# Patient Record
Sex: Female | Born: 1950 | Race: White | Hispanic: No | Marital: Single | Smoking: Never smoker
Health system: Southern US, Community
[De-identification: ages and names within clinical notes are randomized; demographics above are authoritative.]

## PROBLEM LIST (undated history)

## (undated) DIAGNOSIS — T7840XA Allergy, unspecified, initial encounter: Secondary | ICD-10-CM

## (undated) DIAGNOSIS — I619 Nontraumatic intracerebral hemorrhage, unspecified: Secondary | ICD-10-CM

## (undated) DIAGNOSIS — K219 Gastro-esophageal reflux disease without esophagitis: Secondary | ICD-10-CM

## (undated) DIAGNOSIS — I10 Essential (primary) hypertension: Secondary | ICD-10-CM

## (undated) HISTORY — PX: TONSILLECTOMY: SUR1361

## (undated) HISTORY — PX: APPENDECTOMY: SHX54

## (undated) HISTORY — DX: Gastro-esophageal reflux disease without esophagitis: K21.9

## (undated) HISTORY — PX: ABDOMINAL HYSTERECTOMY: SHX81

## (undated) HISTORY — PX: CHOLECYSTECTOMY: SHX55

## (undated) HISTORY — DX: Allergy, unspecified, initial encounter: T78.40XA

---

## 2019-12-03 ENCOUNTER — Emergency Department: Payer: BC Managed Care – PPO

## 2019-12-03 ENCOUNTER — Emergency Department
Admission: EM | Admit: 2019-12-03 | Discharge: 2019-12-04 | Disposition: A | Discharge status: Other Acute Inpatient Hospital | Payer: BC Managed Care – PPO | Attending: Emergency Medicine | Admitting: Emergency Medicine

## 2019-12-03 ENCOUNTER — Other Ambulatory Visit: Payer: Self-pay

## 2019-12-03 DIAGNOSIS — Z20822 Contact with and (suspected) exposure to covid-19: Secondary | ICD-10-CM | POA: Insufficient documentation

## 2019-12-03 DIAGNOSIS — I619 Nontraumatic intracerebral hemorrhage, unspecified: Secondary | ICD-10-CM | POA: Insufficient documentation

## 2019-12-03 DIAGNOSIS — R4182 Altered mental status, unspecified: Secondary | ICD-10-CM | POA: Diagnosis present

## 2019-12-03 LAB — URINE DRUG SCREEN, QUALITATIVE (ARMC ONLY)
Amphetamines, Ur Screen: NOT DETECTED
Barbiturates, Ur Screen: NOT DETECTED
Benzodiazepine, Ur Scrn: NOT DETECTED
Cannabinoid 50 Ng, Ur ~~LOC~~: NOT DETECTED
Cocaine Metabolite,Ur ~~LOC~~: NOT DETECTED
MDMA (Ecstasy)Ur Screen: NOT DETECTED
Methadone Scn, Ur: NOT DETECTED
Opiate, Ur Screen: NOT DETECTED
Phencyclidine (PCP) Ur S: NOT DETECTED
Tricyclic, Ur Screen: NOT DETECTED

## 2019-12-03 LAB — BASIC METABOLIC PANEL
Anion gap: 8 (ref 5–15)
BUN: 16 mg/dL (ref 8–23)
CO2: 28 mmol/L (ref 22–32)
Calcium: 8.7 mg/dL — ABNORMAL LOW (ref 8.9–10.3)
Chloride: 104 mmol/L (ref 98–111)
Creatinine, Ser: 1.14 mg/dL — ABNORMAL HIGH (ref 0.44–1.00)
GFR calc Af Amer: 58 mL/min — ABNORMAL LOW (ref >60)
GFR calc non Af Amer: 50 mL/min — ABNORMAL LOW (ref >60)
Glucose, Bld: 88 mg/dL (ref 70–99)
Potassium: 3.4 mmol/L — ABNORMAL LOW (ref 3.5–5.1)
Sodium: 140 mmol/L (ref 135–145)

## 2019-12-03 LAB — URINALYSIS, COMPLETE (UACMP) WITH MICROSCOPIC
Bacteria, UA: NONE SEEN
Bilirubin Urine: NEGATIVE
Glucose, UA: NEGATIVE mg/dL
Hgb urine dipstick: NEGATIVE
Ketones, ur: NEGATIVE mg/dL
Leukocytes,Ua: NEGATIVE
Nitrite: NEGATIVE
Protein, ur: NEGATIVE mg/dL
Specific Gravity, Urine: 1.005 (ref 1.005–1.030)
pH: 8 (ref 5.0–8.0)

## 2019-12-03 LAB — CBC
HCT: 39.7 % (ref 36.0–46.0)
Hemoglobin: 12.7 g/dL (ref 12.0–15.0)
MCH: 28.4 pg (ref 26.0–34.0)
MCHC: 32 g/dL (ref 30.0–36.0)
MCV: 88.8 fL (ref 80.0–100.0)
Platelets: 274 10*3/uL (ref 150–400)
RBC: 4.47 MIL/uL (ref 3.87–5.11)
RDW: 14 % (ref 11.5–15.5)
WBC: 6.4 10*3/uL (ref 4.0–10.5)
nRBC: 0 % (ref 0.0–0.2)

## 2019-12-03 LAB — SARS CORONAVIRUS 2 BY RT PCR (HOSPITAL ORDER, PERFORMED IN ~~LOC~~ HOSPITAL LAB): SARS Coronavirus 2: NEGATIVE

## 2019-12-03 MED ORDER — NICARDIPINE HCL IN NACL 20-0.86 MG/200ML-% IV SOLN
3.0000 mg/h | INTRAVENOUS | Status: DC
  Administered 2019-12-03: 5 mg/h via INTRAVENOUS
  Filled 2019-12-03 (×2): qty 200

## 2019-12-03 MED ORDER — GADOBUTROL 1 MMOL/ML IV SOLN
6.0000 mL | Freq: Once | INTRAVENOUS | Status: AC | PRN
  Administered 2019-12-03: 6 mL via INTRAVENOUS

## 2019-12-03 NOTE — ED Notes (Signed)
Pt transported to MRI 

## 2019-12-03 NOTE — ED Notes (Addendum)
Pt presents to the ED due to forgetfulness. States that she got her COVID vaccine on Thursday and felt like this on Saturday. Pt states she was lightheaded and dizzy on Saturday but not currently. Pt is A&Ox4 and NAD at this time.

## 2019-12-03 NOTE — ED Provider Notes (Signed)
[facility] Emergency Department Provider Note   ____________________________________________    I have reviewed the triage vital signs and the nursing notes.   HISTORY  Chief Complaint Memory problems    HPI Alicia Cherry is a 69 y.o. female who presents with complaints of problems with memory.  Patient notes she has never had problems with memory, she does work in [employer].  She did have Covid in the fall, had first dose of Moderna vaccine 4 days ago.  2 days after vaccine which she reports only gave her mild fatigue she went to work and realized that she could not remember her passwords, this is highly unusual for her.  She went home and also had difficulty remembering how to use her TV.  She thought this would get better and went to work the next day and found that she was also having difficulty with simple tasks and had forgotten her passwords again.  She denies headaches, no extremity weakness numbness or tingling.  No nausea or vomiting.  No trauma.  No fevers or chills  History reviewed. No pertinent past medical history.  There are no problems to display for this patient.   History reviewed. No pertinent surgical history.  Prior to Admission medications   Not on File     Allergies Patient has no known allergies.  History reviewed. No pertinent family history.  Social History Social History   Tobacco Use  . Smoking status: Never Smoker  . Smokeless tobacco: Never Used  Substance Use Topics  . Alcohol use: Not Currently  . Drug use: Never    Review of Systems  Constitutional: No fever/chills Eyes: No visual changes.  ENT: No sore throat. Cardiovascular: Denies chest pain. Respiratory: Denies shortness of breath. Gastrointestinal: As above Genitourinary: Negative for incontinence Musculoskeletal: Negative for back pain. Skin: Negative for rash. Neurological: As  above   ____________________________________________   PHYSICAL EXAM:  VITAL SIGNS: ED Triage Vitals  Enc Vitals Group     BP 12/03/19 1425 (!) 186/107     Pulse Rate 12/03/19 1425 66     Resp 12/03/19 1425 18     Temp 12/03/19 1429 98.6 F (37 C)     Temp Source 12/03/19 1429 Oral     SpO2 12/03/19 1425 97 %     Weight 12/03/19 1425 63.5 kg (140 lb)     Height 12/03/19 1425 1.549 m (5\' 1" )     Head Circumference --      Peak Flow --      Pain Score 12/03/19 1425 0     Pain Loc --      Pain Edu? --      Excl. in GC? --     Constitutional: Alert and oriented. No acute distress. Pleasant and interactive Eyes: Conjunctivae are normal.  PERRLA Head: Atraumatic. Nose: No congestion/rhinnorhea. Mouth/Throat: Mucous membranes are moist.   Neck:  Painless ROM Cardiovascular: Normal rate, regular rhythm. 12/05/19 peripheral circulation. Respiratory: Normal respiratory effort.  No retractions.   Musculoskeletal: No lower extremity tenderness nor edema.  Warm and well perfused Neurologic:  Normal speech and language. No gross focal neurologic deficits are appreciated.  Skin:  Skin is warm, dry and intact. No rash noted. Psychiatric: Mood and affect are normal. Speech and behavior are normal.  ____________________________________________   LABS (all labs ordered are listed, but only abnormal results are displayed)  Labs Reviewed  BASIC METABOLIC PANEL - Abnormal; Notable for the following components:  Result Value   Potassium 3.4 (*)    Creatinine, Ser 1.14 (*)    Calcium 8.7 (*)    GFR calc non Af Amer 50 (*)    GFR calc Af Amer 58 (*)    All other components within normal limits  URINALYSIS, COMPLETE (UACMP) WITH MICROSCOPIC - Abnormal; Notable for the following components:   Color, Urine COLORLESS (*)    APPearance CLEAR (*)    All other components within normal limits  SARS CORONAVIRUS 2 BY RT PCR (HOSPITAL ORDER, [place] [facility])  CBC   URINE DRUG SCREEN, QUALITATIVE ([facility] ONLY)  CBG MONITORING, ED   ____________________________________________  EKG  ED ECG REPORT I, Lavonia Drafts, the attending physician, personally viewed and interpreted this ECG.  Date: 12/03/2019  Rhythm: normal sinus rhythm QRS Axis: normal Intervals: normal ST/T Wave abnormalities: Nonspecific changes Narrative Interpretation: no evidence of acute ischemia  ____________________________________________  RADIOLOGY  MRI brain demonstrates intracerebral hemorrhage CT scan demonstrates intracerebral hemorrhage, likely acute ____________________________________________   PROCEDURES  Procedure(s) performed: No  Procedures   Critical Care performed: yes  CRITICAL CARE Performed by: Lavonia Drafts   Total critical care time: 35 minutes  Critical care time was exclusive of separately billable procedures and treating other patients.  Critical care was necessary to treat or prevent imminent or life-threatening deterioration.  Critical care was time spent personally by me on the following activities: development of treatment plan with patient and/or surrogate as well as nursing, discussions with consultants, evaluation of patient's response to treatment, examination of patient, obtaining history from patient or surrogate, ordering and performing treatments and interventions, ordering and review of laboratory studies, ordering and review of radiographic studies, pulse oximetry and re-evaluation of patient's condition.  ____________________________________________   INITIAL IMPRESSION / ASSESSMENT AND PLAN / ED COURSE  Pertinent labs & imaging results that were available during my care of the patient were reviewed by me and considered in my medical decision making (see chart for details).  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   Doubt this is related to recent Covid vaccine.  Could be related to onset of dementia, CVA, mass.  Discussed with patient and I recommended MRI of the brain to rule out structural abnormalities which she agreed with.  Contacted by radiologist regarding hemorrhage on MRI, he recommends CT scan for baseline as well.  Discussed with patient at length, she will need to be on a nicardipine drip, will contact [facility] at her request.  Patient accepted by [facility] ICU team, attending Dr. Maceo Pro    ____________________________________________   FINAL CLINICAL IMPRESSION(S) / ED DIAGNOSES  Final diagnoses:  Nontraumatic intracerebral hemorrhage, unspecified cerebral location, unspecified laterality [facility])        Note:  This document was prepared using Dragon voice recognition software and may include unintentional dictation errors.   Lavonia Drafts, MD 12/03/19 2230

## 2019-12-03 NOTE — ED Triage Notes (Signed)
Pt comes POV with dizziness and lightheadedness for two days. Pt reports forgetting her passcode for work. Denies falling or LOC. Covid shot on Wednesday. AOx4.

## 2019-12-03 NOTE — ED Notes (Signed)
Pt to CT at this time.

## 2019-12-03 NOTE — ED Notes (Signed)
Helped pt to toilet. 

## 2019-12-03 NOTE — ED Notes (Signed)
David from [employer], ETA 20-30 mins.

## 2019-12-03 NOTE — ED Notes (Signed)
EDP @ bedside 

## 2019-12-18 ENCOUNTER — Telehealth: Payer: Self-pay | Admitting: Family Medicine

## 2019-12-18 ENCOUNTER — Other Ambulatory Visit: Payer: Self-pay

## 2019-12-18 ENCOUNTER — Telehealth: Payer: Self-pay | Admitting: *Deleted

## 2019-12-18 ENCOUNTER — Ambulatory Visit (INDEPENDENT_AMBULATORY_CARE_PROVIDER_SITE_OTHER): Payer: BC Managed Care – PPO | Admitting: Family Medicine

## 2019-12-18 ENCOUNTER — Encounter: Payer: Self-pay | Admitting: Family Medicine

## 2019-12-18 VITALS — BP 140/99 | HR 76 | Temp 97.7°F | Resp 16 | Ht 61.0 in | Wt 154.8 lb

## 2019-12-18 DIAGNOSIS — I610 Nontraumatic intracerebral hemorrhage in hemisphere, subcortical: Secondary | ICD-10-CM

## 2019-12-18 DIAGNOSIS — Z7689 Persons encountering health services in other specified circumstances: Secondary | ICD-10-CM

## 2019-12-18 DIAGNOSIS — E782 Mixed hyperlipidemia: Secondary | ICD-10-CM | POA: Diagnosis not present

## 2019-12-18 DIAGNOSIS — I69319 Unspecified symptoms and signs involving cognitive functions following cerebral infarction: Secondary | ICD-10-CM

## 2019-12-18 DIAGNOSIS — I618 Other nontraumatic intracerebral hemorrhage: Secondary | ICD-10-CM | POA: Diagnosis not present

## 2019-12-18 DIAGNOSIS — I10 Essential (primary) hypertension: Secondary | ICD-10-CM | POA: Diagnosis not present

## 2019-12-18 DIAGNOSIS — I619 Nontraumatic intracerebral hemorrhage, unspecified: Secondary | ICD-10-CM | POA: Insufficient documentation

## 2019-12-18 DIAGNOSIS — I6930 Unspecified sequelae of cerebral infarction: Secondary | ICD-10-CM

## 2019-12-18 MED ORDER — ATORVASTATIN CALCIUM 80 MG PO TABS: 80.0000 mg | ORAL_TABLET | Freq: Every day | ORAL | 2 refills | Status: DC

## 2019-12-18 MED ORDER — LOSARTAN POTASSIUM 50 MG PO TABS: 50.0000 mg | ORAL_TABLET | Freq: Every day | ORAL | 2 refills | Status: DC

## 2019-12-18 NOTE — Patient Instructions (Addendum)
Thank you for coming to the office today.  At your appointment tomorrow with Dr Sherryll Burger for Neurology.  Neurology apt tomorrow   Jolene Provost, MD  Neurology  [address]  [place] [address]    Phone: [phone]  --------------------------------------------------------- Ask if you still need to see the following doctor on 03/05/20   Hal Hope, MD  Neurology  [address] [place] [address]    Phone: [phone] -------------------------------------------------------  INCREASE Losartan from 25mg  up to 50mg  - you can take TWO of the current pills that you have now in the bottle, 2 pills at once in morning. OR you can pick up the new rx sent to [facility] for the 50mg  tablet.  Continue Atorvastatin 80mg  daily added refills  Hold off on 2nd dose COVID vaccine for now.   Please schedule a Follow-up Appointment to: Return in about 6 weeks (around 01/29/2020) for intracranial hemorrhage stroke / follow-up Neuro.  If you have any other questions or concerns, please feel free to call the office or send a message through MyChart. You may also schedule an earlier appointment if necessary.  Additionally, you may be receiving a survey about your experience at our office within a few days to 1 week by e-mail or mail. We value your feedback.  [facility], DO [facility], 

## 2019-12-18 NOTE — Telephone Encounter (Signed)
'  Alicia Cherry' with [employer] calling regarding disability claim.  Requesting "Confirmation of stroke deficits and Plan of Care."  CB# [phone]

## 2019-12-18 NOTE — Progress Notes (Signed)
Subjective:    Patient ID: Alicia Cherry, female    DOB: [birth year], 69 y.o.   MRN: [id number]  Alicia Cherry is a 69 y.o. female presenting on 12/18/2019 for [place] (pt moved from [place] had covid shot on 11/29/2019 was diagnosed with  intracranial hemorrhage.  )  Moved from [place] down to [place] in Feb / March 2021. 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 No significant problems. She was not treated for HTN. Not on meds. She described original issue with memory loss confusion, with couldn't recall passwords at work, ultimately she was evaluated in ED [facility] on 12/03/19 about 4 days after initial 1st dose Moderna vaccine however later ED said less likely attributed to that. She had been high functioning before without memory issues. This was new change. ED had initial imaging Head CT + MRI, showed thalamic hemorrhagic stroke. She was placed on nifedipine drip for blood pressure control and transferred to [facility] for ICU care. And neurosurgery consultation at [facility] determined no acute surgical intervention, and recommended medical management for pressure control, ultimately repeat imaging and evaluation thought that bleed was due to HTN uncontrolled, also taken OFF Aspirin. She was started on Losartan 25mg  daily and Atorvastatin and arranged follow-up  Hospitalized at [facility] 12/04/19 through 12/07/19 (discharge)  Goal improve SBP < 140 Neurosurgery angiogram 2-3 weeks Neurology repeat MRI brain 4-6 weeks  [facility] had been following her virtually since discharge, Shelle Iron NP and reached out to provide updates on this patient to our office. She expressed concerns about the patient's poor insight and lack of support system here in [place] and concerns with driving. - Today patient reports, she feels safe driving, has no  physical impairment or deficits that impair her ability to drive  Upcoming [facility] - Dr. Jennings Books, tomorrow  Appointment with Speech on the 16th and Occupational Therapy on Friday.   Patient is asking about return to work  EEG with neuronal dysfunction L temporal region.  Discharged to home health therapy.   Health Maintenance: UTD 1 of 2 Moderna COVID19 vaccine, asking about 2nd dose. Ultimately we agreed to hold off on 2nd dose for now until further work up. May be safe uncertain if initial vaccine triggered this episode  Depression screen PHQ 2/9 12/18/2019  Decreased Interest 0  Down, Depressed, Hopeless 0  PHQ - 2 Score 0    Past Medical History:  Diagnosis Date  . Allergy   . GERD (gastroesophageal reflux disease)    Past Surgical History:  Procedure Laterality Date  . ABDOMINAL HYSTERECTOMY    . APPENDECTOMY     Social History   Socioeconomic History  . Marital status: Single    Spouse name: Not on file  . Number of children: Not on file  . Years of education: Not on file  . Highest education level: Not on file  Occupational History  . Not on file  Tobacco Use  . Smoking status: Never Smoker  . Smokeless tobacco: Never Used  Substance and Sexual Activity  . Alcohol use: Not Currently  . Drug use: Never  . Sexual activity: Not Currently  Other Topics Concern  . Not on file  Social History Narrative  . Not on file   Social Determinants of Health   Financial Resource Strain:   . Difficulty  of Paying Living Expenses:   Food Insecurity:   . Worried About Programme researcher, broadcasting/film/video in the Last Year:   . Barista in the Last Year:   Transportation Needs:   . Freight forwarder (Medical):   Marland Kitchen Lack of Transportation (Non-Medical):   Physical Activity:   . Days of Exercise per Week:   . Minutes of Exercise per Session:   Stress:   . Feeling of Stress :   Social Connections:   . Frequency of Communication with Friends and  Family:   . Frequency of Social Gatherings with Friends and Family:   . Attends Religious Services:   . Active Member of Clubs or Organizations:   . Attends Banker Meetings:   Marland Kitchen Marital Status:   Intimate Partner Violence:   . Fear of Current or Ex-Partner:   . Emotionally Abused:   Marland Kitchen Physically Abused:   . Sexually Abused:    History reviewed. No pertinent family history. No current outpatient medications on file prior to visit.   No current facility-administered medications on file prior to visit.    Review of Systems Per HPI unless specifically indicated above      Objective:    BP (!) 140/99   Pulse 76   Temp 97.7 F (36.5 C) (Temporal)   Resp 16   Ht 5\' 1"  (1.549 m)   Wt 154 lb 12.8 oz (70.2 kg)   SpO2 99%   BMI 29.25 kg/m   Wt Readings from Last 3 Encounters:  12/18/19 154 lb 12.8 oz (70.2 kg)  12/03/19 140 lb (63.5 kg)    Physical Exam Vitals and nursing note reviewed.  Constitutional:      General: She is not in acute distress.    Appearance: She is well-developed. She is not diaphoretic.     Comments: Well-appearing, comfortable, cooperative  HENT:     Head: Normocephalic and atraumatic.  Eyes:     General:        Right eye: No discharge.        Left eye: No discharge.     Conjunctiva/sclera: Conjunctivae normal.  Neck:     Thyroid: No thyromegaly.  Cardiovascular:     Rate and Rhythm: Normal rate and regular rhythm.     Heart sounds: Normal heart sounds. No murmur heard.   Pulmonary:     Effort: Pulmonary effort is normal. No respiratory distress.     Breath sounds: Normal breath sounds. No wheezing or rales.  Musculoskeletal:        General: Normal range of motion.     Cervical back: Normal range of motion and neck supple.  Lymphadenopathy:     Cervical: No cervical adenopathy.  Skin:    General: Skin is warm and dry.     Findings: No erythema or rash.  Neurological:     General: No focal deficit present.     Mental  Status: She is alert and oriented to person, place, and time.     Cranial Nerves: No cranial nerve deficit.     Sensory: No sensory deficit.     Motor: No weakness.  Psychiatric:        Behavior: Behavior normal.        Thought Content: Thought content normal.     Comments: Well groomed, good eye contact, normal speech at times but some difficulty with word finding and occasionally will stop her thought and restart or does not finish thoughts. She does provide  historically accurate information and follows all commands. No abnormal thoughts or abnormal behaviors or perceptions.       I have personally reviewed the radiology report from 12/03/19 CT and MRI  CT Head Wo ContrastPerformed 12/03/2019 Final result  Study Result CLINICAL DATA: Cerebral hemorrhage on brain MRI.  EXAM: CT HEAD WITHOUT CONTRAST  TECHNIQUE: Contiguous axial images were obtained from the base of the skull through the vertex without intravenous contrast.  COMPARISON: Brain MRI earlier today.  FINDINGS: Brain: Left thalamic hemorrhage measures 1.4 x 0.9 x 1.1 cm (volume = 0.7 cm^3), as seen on MRI earlier today, appears acute. Mild surrounding edema. There is no intraventricular extension. No midline shift. Normal ventricular size without hydrocephalus. No other interval change.  Vascular: No hyperdense vessel.  Skull: No fracture or focal lesion.  Sinuses/Orbits: Opacification of right side of sphenoid sinus with bony sclerosis and thickening, chronic. Mastoid air cells are clear. Orbits are unremarkable.  Other: None.  IMPRESSION: 1. Left thalamic hemorrhage measuring 1.4 x 0.9 x 1.1 cm, as seen on MRI earlier today, appears acute. Mild surrounding edema. No intraventricular extension. 2. No other interval change.   Electronically Signed By: Narda RutherfordMelanie Sanford M.D. On: 12/03/2019 20:55  ---------------------------------------------------------  MR BRAIN W WO CONTRASTPerformed 12/03/2019 Final  result  Study Result CLINICAL DATA: Dizziness and lightheaded  EXAM: MRI HEAD WITHOUT AND WITH CONTRAST  TECHNIQUE: Multiplanar, multiecho pulse sequences of the brain and surrounding structures were obtained without and with intravenous contrast.  CONTRAST: 6mL GADAVIST GADOBUTROL 1 MMOL/ML IV SOLN  COMPARISON: None.  FINDINGS: Brain: Image quality degraded by rapid scanning sequences as the patient had difficulty holding still.  Hematoma in the left medial thalamus with susceptibility on gradient echo imaging. There is surrounding edema on T2 and FLAIR suggesting that this is recent. Following contrast infusion, no underlying mass identified. There is an enhancing vein just above the hematoma which is slightly larger than the similar vein on the right side. No definite vascular malformation identified.  Ventricle size normal. Negative for acute infarct. Scattered chronic microvascular ischemic change in the white matter of a moderate degree. Brainstem intact.  Normal enhancement postcontrast infusion.  Vascular: Normal arterial flow voids  Skull and upper cervical spine: Negative  Sinuses/Orbits: Negative  Other: None  IMPRESSION: Hemorrhage in the left medial thalamus with surrounding edema. This is most likely a recent hemorrhage. No underlying infarct or mass. Recommend head CT to assist with dating of the hemorrhage.  No acute infarct. Moderate chronic microvascular ischemic change in the white matter.  These results were called by telephone at the time of interpretation on 12/03/2019 at 7:19 pm to provider Jene EveryOBERT KINNER , who verbally acknowledged these results.   Electronically Signed By: Marlan Palauharles Clark M.D. On: 12/03/2019 19:19    Results for orders placed or performed during the hospital encounter of 12/03/19  SARS Coronavirus 2 by RT PCR (hospital order, performed in [facility] hospital lab) Nasopharyngeal Nasopharyngeal Swab   Specimen:  Nasopharyngeal Swab  Result Value Ref Range   SARS Coronavirus 2 NEGATIVE NEGATIVE  Basic metabolic panel  Result Value Ref Range   Sodium 140 135 - 145 mmol/L   Potassium 3.4 (L) 3.5 - 5.1 mmol/L   Chloride 104 98 - 111 mmol/L   CO2 28 22 - 32 mmol/L   Glucose, Bld 88 70 - 99 mg/dL   BUN 16 8 - 23 mg/dL   Creatinine, Ser 1.611.14 (H) 0.44 - 1.00 mg/dL   Calcium 8.7 (L) 8.9 -  10.3 mg/dL   GFR calc non Af Amer 50 (L) >60 mL/min   GFR calc Af Amer 58 (L) >60 mL/min   Anion gap 8 5 - 15  CBC  Result Value Ref Range   WBC 6.4 4.0 - 10.5 K/uL   RBC 4.47 3.87 - 5.11 MIL/uL   Hemoglobin 12.7 12.0 - 15.0 g/dL   HCT 34.7 36 - 46 %   MCV 88.8 80.0 - 100.0 fL   MCH 28.4 26.0 - 34.0 pg   MCHC 32.0 30.0 - 36.0 g/dL   RDW 42.5 95.6 - 38.7 %   Platelets 274 150 - 400 K/uL   nRBC 0.0 0.0 - 0.2 %  Urinalysis, Complete w Microscopic  Result Value Ref Range   Color, Urine COLORLESS (A) YELLOW   APPearance CLEAR (A) CLEAR   Specific Gravity, Urine 1.005 1.005 - 1.030   pH 8.0 5.0 - 8.0   Glucose, UA NEGATIVE NEGATIVE mg/dL   Hgb urine dipstick NEGATIVE NEGATIVE   Bilirubin Urine NEGATIVE NEGATIVE   Ketones, ur NEGATIVE NEGATIVE mg/dL   Protein, ur NEGATIVE NEGATIVE mg/dL   Nitrite NEGATIVE NEGATIVE   Leukocytes,Ua NEGATIVE NEGATIVE   RBC / HPF 0-5 0 - 5 RBC/hpf   WBC, UA 0-5 0 - 5 WBC/hpf   Bacteria, UA NONE SEEN NONE SEEN   Squamous Epithelial / LPF 0-5 0 - 5  Urine Drug Screen, Qualitative  Result Value Ref Range   Tricyclic, Ur Screen NONE DETECTED NONE DETECTED   Amphetamines, Ur Screen NONE DETECTED NONE DETECTED   MDMA (Ecstasy)Ur Screen NONE DETECTED NONE DETECTED   Cocaine Metabolite,Ur [place] NONE DETECTED NONE DETECTED   Opiate, Ur Screen NONE DETECTED NONE DETECTED   Phencyclidine (PCP) Ur S NONE DETECTED NONE DETECTED   Cannabinoid 50 Ng, Ur [place] NONE DETECTED NONE DETECTED   Barbiturates, Ur Screen NONE DETECTED NONE DETECTED   Benzodiazepine, Ur Scrn NONE DETECTED NONE DETECTED    Methadone Scn, Ur NONE DETECTED NONE DETECTED      Assessment & Plan:   Problem List Items Addressed This Visit    Thalamic hemorrhage (HCC)   Relevant Orders   Ambulatory referral to Chronic Care Management Services   Mixed hyperlipidemia   Relevant Medications   losartan (COZAAR) 50 MG tablet   atorvastatin (LIPITOR) 80 MG tablet   Other Relevant Orders   Ambulatory referral to Chronic Care Management Services   Intracerebral hematoma [facility])   Relevant Orders   Ambulatory referral to Chronic Care Management Services   History of cerebrovascular accident (CVA) with residual deficit   Relevant Orders   Ambulatory referral to Chronic Care Management Services   Essential hypertension   Relevant Medications   losartan (COZAAR) 50 MG tablet   atorvastatin (LIPITOR) 80 MG tablet   Other Relevant Orders   Ambulatory referral to Chronic Care Management Services   Cognitive deficit due to recent cerebrovascular accident (CVA) - Primary   Relevant Medications   losartan (COZAAR) 50 MG tablet   atorvastatin (LIPITOR) 80 MG tablet   Other Relevant Orders   Ambulatory referral to Chronic Care Management Services    Other Visit Diagnoses    Encounter to establish care with new doctor          Complex constellation of problems following  recent acute thalamic hemorrhagic stroke Likely secondary to HTN, possible AVM, had untreated HTN and was on ASA New neurological deficit with this hospitalization as she did not have known prior deficits, some moderate white  matter changes on MRI Images reviewed Hospital record from [facility] reviewed, Neuro/Neurosurgery consultation  Today in follow=up still elevated BP >140 Will increase Losartan from 25 to 50mg  - she can double current pill but advised to start new Losartan 50mg  daily once daily for now, and monitor BP Continue Atorvastatin for ASCVD secondary prevention Remain OFF Aspirin  Continue w/ Follow-ups as scheduled [facility]  tomorrow Dr , may help determine patient's return to work status and considering if should get 2nd dose covid vaccine at this time.  Keep upcoming f/u with PT OT SLP Future f/u with Neurosurgery after appropriate time interval 6-10 weeks determine with imaging if any further intervention  Patient has no physical impairment that would limit her from physically driving vehicle, she has good functional memory but has difficulty with recall. She has driven herself to our office for first time without problem, she adamantly states she is safe to drive and understands to seek help or call for help if needed sooner if problem.   Orders Placed This Encounter  Procedures  . Ambulatory referral to Chronic Care Management Services    Referral Priority:   Routine    Referral Type:   Consultation    Referral Reason:   Care Coordination    Number of Visits Requested:   1     Meds ordered this encounter  Medications  . losartan (COZAAR) 50 MG tablet    Sig: Take 1 tablet (50 mg total) by mouth daily.    Dispense:  30 tablet    Refill:  2  . atorvastatin (LIPITOR) 80 MG tablet    Sig: Take 1 tablet (80 mg total) by mouth daily.    Dispense:  30 tablet    Refill:  2      Follow up plan: Return in about 6 weeks (around 01/29/2020) for intracranial hemorrhage stroke / follow-up Neuro.    Sherryll Burger, DO [facility] 12/18/2019, 3:38 PM

## 2019-12-18 NOTE — Telephone Encounter (Signed)
Alicia Meckel, NP with [name] calls with health update on patient. 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 Patient received first Covid on 11/29/19 then hospitalized on 12/03/19 with an intracranial hemorrhage.  Alicia Cherry has been in contact with the patient since her hospital discharge. She would like for the physician to have the following information regarding the patient as her assessments have noted the patient to have poor insight as to her diagnosis/condition as well as her deficits: Patient has no support system/no family/friends she is willing to turn to for assistance.  Has an appointment with Dr. Cristopher Peru, tomorrow. Appointment with Speech on the 16th and Occupational Therapy on Friday.  Alicia Cherry feels she may need a driving test done by OT as she is not sure the patient is able to drive safely at this time.  Alicia Cherry has offered free [name] services to all of her medical appointments provided by [name] but the patient has refused several times-please convey to the patient if felt she may not be able to drive safely at this time given her deficits.

## 2019-12-19 ENCOUNTER — Telehealth: Payer: Self-pay | Admitting: Family Medicine

## 2019-12-19 NOTE — Telephone Encounter (Signed)
Jonetta Speak as per Dr.K and the number given for neurologist Dr. Sherryll Burger.

## 2019-12-19 NOTE — Chronic Care Management (AMB) (Signed)
  Chronic Care Management   Note  12/19/2019 Name: Alicia Cherry MRN: [id number] DOB: 04-21-1951  Alicia Cherry is a 69 y.o. year old female who is a primary care patient of Olin Hauser, DO. I reached out to Donalynn Furlong by phone today in response to a referral sent by [name]'s PCP, Nobie Putnam DO     [name] was given information about Chronic Care Management services today including:  1. CCM service includes personalized support from designated clinical staff supervised by her physician, including individualized plan of care and coordination with other care providers 2. 24/7 contact phone numbers for assistance for urgent and routine care needs. 3. Service will only be billed when office clinical staff spend 20 minutes or more in a month to coordinate care. 4. Only one practitioner may furnish and bill the service in a calendar month. 5. The patient may stop CCM services at any time (effective at the end of the month) by phone call to the office staff. 6. The patient will be responsible for cost sharing (co-pay) of up to 20% of the service fee (after annual deductible is met).  Patient agreed to services and verbal consent obtained.   Follow up plan: Telephone appointment with care management team member scheduled for:01/22/2020  Glenna Durand, LPN Health Advisor, [employer] ??[email or web address] ??[phone]

## 2019-12-19 NOTE — Telephone Encounter (Signed)
I just saw this patient as new patient. I did not receive any disability claim paperwork.  I honestly do think that it would need to be completed by her Neurologist, her apt is today at [facility]. This type of disability is usually not something that I would be able to handle.  [name], DO [facility] 12/19/2019, 11:03 AM

## 2019-12-20 ENCOUNTER — Telehealth: Payer: Self-pay | Admitting: Family Medicine

## 2019-12-20 NOTE — Telephone Encounter (Signed)
Advised patient as per Dr.K she will try to remember and ask him about paperwork.

## 2019-12-20 NOTE — Telephone Encounter (Signed)
I have received the form from [facility] for more clinical information and records.  It was faxed on 12/15/19  I attempted to call [facility] to try to reach Vilma Meckel NP to follow -up on her request for call back. At the 1800 # but did not reach her. They will leave her a message that I attempted to call.  They gave me her email - [email or web address]  I will call Loletta Parish to find out more information as well.  Alright - I spoke with [facility] representative. They agreed that the Neurologist would need to be the doctor to complete her disability paperwork due to the problem of memory loss with stroke. As I am not able to give an estimated time when she could return.  I gave them the fax # for Dr [facility] and they will fax the Disability paperwork and Attending physician statement to his office now and patient will need to see him on 6/17 and he can complete the disability paperwork for her.  Could you call patient and confirm with her that she needs to discuss her disability paperwork with Dr Sherryll Burger when she sees him to determine a return to work date?   Neurology  [address] [place] [address]    Phone: [phone]  Fax: [phone]   [name], DO [facility] 12/20/2019, 11:49 AM

## 2019-12-20 NOTE — Telephone Encounter (Signed)
Called to verify that a request for clinic notes for patient was received and would like to discuss some information with the nurse or doctor.  Please call to discuss at [phone]

## 2019-12-21 ENCOUNTER — Ambulatory Visit (INDEPENDENT_AMBULATORY_CARE_PROVIDER_SITE_OTHER): Payer: BC Managed Care – PPO | Admitting: General Practice

## 2019-12-21 DIAGNOSIS — I10 Essential (primary) hypertension: Secondary | ICD-10-CM

## 2019-12-21 DIAGNOSIS — E782 Mixed hyperlipidemia: Secondary | ICD-10-CM

## 2019-12-21 DIAGNOSIS — I6930 Unspecified sequelae of cerebral infarction: Secondary | ICD-10-CM

## 2019-12-21 DIAGNOSIS — I69319 Unspecified symptoms and signs involving cognitive functions following cerebral infarction: Secondary | ICD-10-CM

## 2019-12-21 NOTE — Patient Instructions (Signed)
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  Clinical Goal(s) related to HTN, HLD, and impaired memory related to recent CVA:  Over the next 120 days, patient will:   Work with the care management team to address educational, disease management, and care coordination needs   Begin or continue self health monitoring activities as directed today Measure and record blood pressure 1 times per week and adhere to a heart healthy diet  Call provider office for new or worsened signs and symptoms Blood pressure findings outside established parameters and New or worsened symptom related to HLD, stroke or other chronic conditions  Call care management team with questions or concerns  Verbalize basic understanding of patient centered plan of care established today  Interventions related to HTN, HLD, and impaired memory related to recent CVA:   Evaluation of current treatment plans and patient's adherence to plan as established by provider.  The patient "thought" she was okay and was ready to go back to work; however the neurologist does not feel like she needs to go back until July 9th.  The patient expressed anxiety and frustration because of "[place]" and letters she received today.  Will involve the care guides and LCSW for assistance with helping the patient understand paperwork and fill out as needed.   Assessed patient understanding of disease states.  The patient verbalized she did not have any issues until she took the "COVID shot" and 2 days later she was "sick as a dog".  The patient had never had an issue with blood pressure or cholesterol. The patient  verbalized she really does not understand what has happened over the last several weeks.   Assessed patient's education and care coordination needs. The patient needs education and support to help her understand her current state of health and residual effects of the CVA.  The patient educated on the MyChart functionality and a text sent to the patients for for activation. Will work with the patient on health and wellness goals. Unable to discuss taking blood pressures at home today as the patient was stressed over the mail she received today about her time out of work.   Provided disease specific education to patient.  Education on following a low sodium/heart healthy diet. The patient verbalized she was not aware that she needed to restrict her sodium or fats in diet. Extensive education given to the patient on diet and care. The patient verbalized she is able to take care of herself but feels a little "overwhelmed" with everything that has happened recently. Empathetic listening and support given. Will send information by the my Chart functionality and in mail until the patient has her my Chart set up.   Evaluation of memory. The patient had to take a few minutes before she was able to give the [facility] her current address. The patient was not able to tell the RNCM what part of [place] she moved from. During the call the patient would states that she "lost her train of thought"  Collaborated with appropriate clinical care team members regarding patient needs.  LCSW for  assistance with memory loss and other concerns  Care guide referral for assistance with resources in [facility] and possible help with paperwork due to memory impairment.  Review of CCM team roles and provided numbers for the patient. The patient is thankful for any support that may be provided.   Patient Self Care Activities related to HTN, HLD, and impaired memory related to recent CVA  Patient is unable to independently self-manage  chronic health conditions  Initial goal documentation        Alicia Cherry was given information about Chronic Care Management services today including:  1. CCM service includes personalized support from designated clinical staff supervised by her physician, including individualized plan of care and coordination with other care providers 2. 24/7 contact phone numbers for assistance for urgent and routine care needs. 3. Service will only be billed when office clinical staff spend 20 minutes or more in a month to coordinate care. 4. Only one practitioner may furnish and bill the service in a calendar month. 5. The patient may stop CCM services at any time (effective at the end of the month) by phone call to the office staff. 6. The patient will be responsible for cost sharing (co-pay) of up to 20% of the service fee (after annual deductible is met).  Patient agreed to services and verbal consent obtained.   The patient verbalized understanding of instructions provided today and agreed to receive a mailed copy of patient instruction and/or educational materials.  The care management team will reach out to the patient again over the next 30 days.   Noreene Larsson RN, MSN, [facility] Mobile: [phone]   DASH Eating Plan DASH stands for "Dietary Approaches to Stop Hypertension." The DASH eating plan is a healthy eating plan that has been shown to reduce high blood pressure (hypertension). It may also reduce your risk for type 2 diabetes, heart disease, and stroke. The DASH eating plan may also help with weight loss. What are tips for following this plan?  General guidelines  Avoid eating more than 2,300 mg (milligrams) of salt (sodium) a day. If you have hypertension, you may need to reduce your sodium intake to 1,500 mg a day.  Limit alcohol intake to no more than 1 drink a day for nonpregnant women and  2 drinks a day for men. One drink equals 12 oz of beer, 5 oz of wine, or 1 oz of hard liquor.  Work with your health care provider to maintain a healthy body weight or to lose weight. Ask what an ideal weight is for you.  Get at least 30 minutes of exercise that causes your heart to beat faster (aerobic exercise) most days of the week. Activities may include walking, swimming, or biking.  Work with your health care provider or diet and nutrition specialist (dietitian) to adjust your eating plan to your individual calorie needs. Reading food labels   Check food labels for the amount of sodium per serving. Choose foods with less than 5 percent of the Daily Value of sodium. Generally, foods with less than 300 mg of sodium per serving fit into this eating plan.  To find whole grains, look for the word "whole" as the first word in the ingredient list. Shopping  Buy products labeled as "low-sodium" or "no salt added."  Buy fresh foods. Avoid canned foods and premade or frozen meals. Cooking  Avoid adding salt when cooking. Use  salt-free seasonings or herbs instead of table salt or sea salt. Check with your health care provider or pharmacist before using salt substitutes.  Do not fry foods. Cook foods using healthy methods such as baking, boiling, grilling, and broiling instead.  Cook with heart-healthy oils, such as olive, canola, soybean, or sunflower oil. Meal planning  Eat a balanced diet that includes: ? 5 or more servings of fruits and vegetables each day. At each meal, try to fill half of your plate with fruits and vegetables. ? Up to 6-8 servings of whole grains each day. ? Less than 6 oz of lean meat, poultry, or fish each day. A 3-oz serving of meat is about the same size as a deck of cards. One egg equals 1 oz. ? 2 servings of low-fat dairy each day. ? A serving of nuts, seeds, or beans 5 times each week. ? Heart-healthy fats. Healthy fats called Omega-3 fatty acids are found in  foods such as flaxseeds and coldwater fish, like sardines, salmon, and mackerel.  Limit how much you eat of the following: ? Canned or prepackaged foods. ? Food that is high in trans fat, such as fried foods. ? Food that is high in saturated fat, such as fatty meat. ? Sweets, desserts, sugary drinks, and other foods with added sugar. ? Full-fat dairy products.  Do not salt foods before eating.  Try to eat at least 2 vegetarian meals each week.  Eat more home-cooked food and less restaurant, buffet, and fast food.  When eating at a restaurant, ask that your food be prepared with less salt or no salt, if possible. What foods are recommended? The items listed may not be a complete list. Talk with your dietitian about what dietary choices are best for you. Grains Whole-grain or whole-wheat bread. Whole-grain or whole-wheat pasta. Brown rice. [place]. Bulgur. Whole-grain and low-sodium cereals. Pita bread. Low-fat, low-sodium crackers. Whole-wheat flour tortillas. Vegetables Fresh or frozen vegetables (raw, steamed, roasted, or grilled). Low-sodium or reduced-sodium tomato and vegetable juice. Low-sodium or reduced-sodium tomato sauce and tomato paste. Low-sodium or reduced-sodium canned vegetables. Fruits All fresh, dried, or frozen fruit. Canned fruit in natural juice (without added sugar). Meat and other protein foods Skinless chicken or [place]. Ground chicken or [place]. Pork with fat trimmed off. Fish and seafood. Egg whites. Dried beans, peas, or lentils. Unsalted nuts, nut butters, and seeds. Unsalted canned beans. Lean cuts of beef with fat trimmed off. Low-sodium, lean deli meat. Dairy Low-fat (1%) or fat-free (skim) milk. Fat-free, low-fat, or reduced-fat cheeses. Nonfat, low-sodium ricotta or cottage cheese. Low-fat or nonfat yogurt. Low-fat, low-sodium cheese. Fats and oils Soft margarine without trans fats. Vegetable oil. Low-fat, reduced-fat, or light mayonnaise and salad  dressings (reduced-sodium). Canola, safflower, olive, soybean, and sunflower oils. Avocado. Seasoning and other foods Herbs. Spices. Seasoning mixes without salt. Unsalted popcorn and pretzels. Fat-free sweets. What foods are not recommended? The items listed may not be a complete list. Talk with your dietitian about what dietary choices are best for you. Grains Baked goods made with fat, such as croissants, muffins, or some breads. Dry pasta or rice meal packs. Vegetables Creamed or fried vegetables. Vegetables in a cheese sauce. Regular canned vegetables (not low-sodium or reduced-sodium). Regular canned tomato sauce and paste (not low-sodium or reduced-sodium). Regular tomato and vegetable juice (not low-sodium or reduced-sodium). Angie Fava. Olives. Fruits Canned fruit in a light or heavy syrup. Fried fruit. Fruit in cream or butter sauce. Meat and other protein foods Fatty cuts  of meat. Ribs. Fried meat. Berniece Salines. Sausage. Bologna and other processed lunch meats. Salami. Fatback. Hotdogs. Bratwurst. Salted nuts and seeds. Canned beans with added salt. Canned or smoked fish. Whole eggs or egg yolks. Chicken or [place] with skin. Dairy Whole or 2% milk, cream, and half-and-half. Whole or full-fat cream cheese. Whole-fat or sweetened yogurt. Full-fat cheese. Nondairy creamers. Whipped toppings. Processed cheese and cheese spreads. Fats and oils Butter. Stick margarine. Lard. Shortening. Ghee. Bacon fat. Tropical oils, such as coconut, palm kernel, or palm oil. Seasoning and other foods Salted popcorn and pretzels. Onion salt, garlic salt, seasoned salt, table salt, and sea salt. Worcestershire sauce. Tartar sauce. Barbecue sauce. Teriyaki sauce. Soy sauce, including reduced-sodium. Steak sauce. Canned and packaged gravies. Fish sauce. Oyster sauce. Cocktail sauce. Horseradish that you find on the shelf. Ketchup. Mustard. Meat flavorings and tenderizers. Bouillon cubes. Hot sauce and Tabasco sauce.  Premade or packaged marinades. Premade or packaged taco seasonings. Relishes. Regular salad dressings. Where to find more information:  National Heart, Lung, and [place]: https://wilson-eaton.com/  American Heart Association: www.heart.org Summary  The DASH eating plan is a healthy eating plan that has been shown to reduce high blood pressure (hypertension). It may also reduce your risk for type 2 diabetes, heart disease, and stroke.  With the DASH eating plan, you should limit salt (sodium) intake to 2,300 mg a day. If you have hypertension, you may need to reduce your sodium intake to 1,500 mg a day.  When on the DASH eating plan, aim to eat more fresh fruits and vegetables, whole grains, lean proteins, low-fat dairy, and heart-healthy fats.  Work with your health care provider or diet and nutrition specialist (dietitian) to adjust your eating plan to your individual calorie needs. This information is not intended to replace advice given to you by your health care provider. Make sure you discuss any questions you have with your health care provider. Document Revised: 06/04/2017 Document Reviewed: 06/15/2016 Elsevier Patient Education  2020 [facility] Discharge After a Stroke  Being discharged from the hospital after a stroke can feel overwhelming. Many things may be different, and it is normal to feel scared or anxious. Some stroke survivors may be able to return to their homes, and others may need more specialized care on a temporary or permanent basis. Your stroke care team will work with you to develop a discharge plan that is best for you. Ask questions if you do not understand something. Invite a friend or family member to participate in discharge planning. Understanding and following your discharge plan can help to prevent another stroke or other problems. Understanding your medicines After a stroke, your health care provider may prescribe one or more types of medicine.  It is important to take medicines exactly as told by your health care provider. Serious harm, such as another stroke, can happen if you are unable to take your medicine exactly as prescribed. Make sure you understand:  What medicine to take.  Why you are taking the medicine.  How and when to take it.  If it can be taken with your other medicines and herbal supplements.  Possible side effects.  When to call your health care provider if you have any side effects.  How you will get and pay for your medicines. Medical assistance programs may be able to help you pay for prescription medicines if you cannot afford them. If you are taking an anticoagulant, be sure to take it exactly as told by your  health care provider. This type of medicine can increase the risk of bleeding because it works to prevent blood from clotting. You may need to take certain precautions to prevent bleeding. You should contact your health care provider if you have:  Bleeding or bruising.  A fall or other injury to your head.  Blood in your urine or stool (feces). Planning for home safety  Take steps to prevent falls, such as installing grab bars or using a shower chair. Ask a friend or family member to get needed things in place before you go home if possible. A therapist can come to your home to make recommendations for safety equipment. Ask your health care provider if you would benefit from this service or from home care. Getting needed equipment Ask your health care provider for a list of any medical equipment and supplies you will need at home. These may include items such as:  Walkers.  Canes.  Wheelchairs.  Hand-strengthening devices.  Special eating utensils. Medical equipment can be rented or purchased, depending on your insurance coverage. Check with your insurance company about what is covered. Keeping follow-up visits After a stroke, you will need to follow up regularly with a health care  provider. You may also need rehabilitation, which can include physical therapy, occupational therapy, or speech-language therapy. Keeping these appointments is very important to your recovery after a stroke. Be sure to bring your medicine list and discharge papers with you to your appointments. If you need help to keep track of your schedule, use a calendar or appointment reminder. Preventing another stroke Having a stroke puts you at risk for another stroke in the future. Ask your health care provider what actions you can take to lower the risk. These may include:  Increasing how much you exercise.  Making a healthy eating plan.  Quitting smoking.  Managing other health conditions, such as high blood pressure, high cholesterol, or diabetes.  Limiting alcohol use. Knowing the warning signs of a stroke  Make sure you understand the signs of a stroke. Before you leave the hospital, you will receive information outlining the stroke warning signs. Share these with your friends and family members. "BE FAST" is an easy way to remember the main warning signs of a stroke:  B - Balance. Signs are dizziness, sudden trouble walking, or loss of balance.  E - Eyes. Signs are trouble seeing or a sudden change in vision.  F - Face. Signs are sudden weakness or numbness of the face, or the face or eyelid drooping on one side.  A - Arms. Signs are weakness or numbness in an arm. This happens suddenly and usually on one side of the body.  S - Speech. Signs are sudden trouble speaking, slurred speech, or trouble understanding what people say.  T - Time. Time to call emergency services. Write down what time symptoms started. Other signs of stroke may include:  A sudden, severe headache with no known cause.  Nausea or vomiting.  Seizure. These symptoms may represent a serious problem that is an emergency. Do not wait to see if the symptoms will go away. Get medical help right away. Call your local  emergency services (911 in the [place] Do not drive yourself to the hospital. Make note of the time that you had your first symptoms. Your emergency responders or emergency room staff will need to know this information. Summary  Being discharged from the hospital after a stroke can feel overwhelming. It is normal to feel  scared or anxious.  Make sure you take medicines exactly as told by your health care provider.  Know the warning signs of a stroke, and get help right way if you have any of these symptoms. "BE FAST" is an easy way to remember the main warning signs of a stroke. This information is not intended to replace advice given to you by your health care provider. Make sure you discuss any questions you have with your health care provider. Document Revised: 03/15/2019 Document Reviewed: 09/25/2016 Elsevier Patient Education  [place].  High Cholesterol  High cholesterol is a condition in which the blood has high levels of a white, waxy, fat-like substance (cholesterol). The human body needs small amounts of cholesterol. The liver makes all the cholesterol that the body needs. Extra (excess) cholesterol comes from the food that we eat. Cholesterol is carried from the liver by the blood through the blood vessels. If you have high cholesterol, deposits (plaques) may build up on the walls of your blood vessels (arteries). Plaques make the arteries narrower and stiffer. Cholesterol plaques increase your risk for heart attack and stroke. Work with your health care provider to keep your cholesterol levels in a healthy range. What increases the risk? This condition is more likely to develop in people who:  Eat foods that are high in animal fat (saturated fat) or cholesterol.  Are overweight.  Are not getting enough exercise.  Have a family history of high cholesterol. What are the signs or symptoms? There are no symptoms of this condition. How is this diagnosed? This condition  may be diagnosed from the results of a blood test.  If you are older than age 76, your health care provider may check your cholesterol every 4-6 years.  You may be checked more often if you already have high cholesterol or other risk factors for heart disease. The blood test for cholesterol measures:  "Bad" cholesterol (LDL cholesterol). This is the main type of cholesterol that causes heart disease. The desired level for LDL is less than 100.  "Good" cholesterol (HDL cholesterol). This type helps to protect against heart disease by cleaning the arteries and carrying the LDL away. The desired level for HDL is 60 or higher.  Triglycerides. These are fats that the body can store or burn for energy. The desired number for triglycerides is lower than 150.  Total cholesterol. This is a measure of the total amount of cholesterol in your blood, including LDL cholesterol, HDL cholesterol, and triglycerides. A healthy number is less than 200. How is this treated? This condition is treated with diet changes, lifestyle changes, and medicines. Diet changes  This may include eating more whole grains, fruits, vegetables, nuts, and fish.  This may also include cutting back on red meat and foods that have a lot of added sugar. Lifestyle changes  Changes may include getting at least 40 minutes of aerobic exercise 3 times a week. Aerobic exercises include walking, biking, and swimming. Aerobic exercise along with a healthy diet can help you maintain a healthy weight.  Changes may also include quitting smoking. Medicines  Medicines are usually given if diet and lifestyle changes have failed to reduce your cholesterol to healthy levels.  Your health care provider may prescribe a statin medicine. Statin medicines have been shown to reduce cholesterol, which can reduce the risk of heart disease. Follow these instructions at home: Eating and drinking If told by your health care provider:  Eat chicken  (without skin), fish, veal,  shellfish, ground [place] breast, and round or loin cuts of red meat.  Do not eat fried foods or fatty meats, such as hot dogs and salami.  Eat plenty of fruits, such as apples.  Eat plenty of vegetables, such as broccoli, potatoes, and carrots.  Eat beans, peas, and lentils.  Eat grains such as barley, rice, couscous, and bulgur wheat.  Eat pasta without cream sauces.  Use skim or nonfat milk, and eat low-fat or nonfat yogurt and cheeses.  Do not eat or drink whole milk, cream, ice cream, egg yolks, or hard cheeses.  Do not eat stick margarine or tub margarines that contain trans fats (also called partially hydrogenated oils).  Do not eat saturated tropical oils, such as coconut oil and palm oil.  Do not eat cakes, cookies, crackers, or other baked goods that contain trans fats.  General instructions  Exercise as directed by your health care provider. Increase your activity level with activities such as gardening, walking, and taking the stairs.  Take over-the-counter and prescription medicines only as told by your health care provider.  Do not use any products that contain nicotine or tobacco, such as cigarettes and e-cigarettes. If you need help quitting, ask your health care provider.  Keep all follow-up visits as told by your health care provider. This is important. Contact a health care provider if:  You are struggling to maintain a healthy diet or weight.  You need help to start on an exercise program.  You need help to stop smoking. Get help right away if:  You have chest pain.  You have trouble breathing. This information is not intended to replace advice given to you by your health care provider. Make sure you discuss any questions you have with your health care provider. Document Revised: 06/25/2017 Document Reviewed: 12/21/2015 Elsevier Patient Education  [place].  Hypertension, Adult Hypertension is another name for  high blood pressure. High blood pressure forces your heart to work harder to pump blood. This can cause problems over time. There are two numbers in a blood pressure reading. There is a top number (systolic) over a bottom number (diastolic). It is best to have a blood pressure that is below 120/80. Healthy choices can help lower your blood pressure, or you may need medicine to help lower it. What are the causes? The cause of this condition is not known. Some conditions may be related to high blood pressure. What increases the risk? Smoking. Having type 2 diabetes mellitus, high cholesterol, or both. Not getting enough exercise or physical activity. Being overweight. Having too much fat, sugar, calories, or salt (sodium) in your diet. Drinking too much alcohol. Having long-term (chronic) kidney disease. Having a family history of high blood pressure. Age. Risk increases with age. Race. You may be at higher risk if you are African American. Gender. Men are at higher risk than women before age 60. After age 66, women are at higher risk than men. Having obstructive sleep apnea. Stress. What are the signs or symptoms? High blood pressure may not cause symptoms. Very high blood pressure (hypertensive crisis) may cause: Headache. Feelings of worry or nervousness (anxiety). Shortness of breath. Nosebleed. A feeling of being sick to your stomach (nausea). Throwing up (vomiting). Changes in how you see. Very bad chest pain. Seizures. How is this treated? This condition is treated by making healthy lifestyle changes, such as: Eating healthy foods. Exercising more. Drinking less alcohol. Your health care provider may prescribe medicine if lifestyle changes  are not enough to get your blood pressure under control, and if: Your top number is above 130. Your bottom number is above 80. Your personal target blood pressure may vary. Follow these instructions at home: Eating and drinking  If  told, follow the DASH eating plan. To follow this plan: Fill one half of your plate at each meal with fruits and vegetables. Fill one fourth of your plate at each meal with whole grains. Whole grains include whole-wheat pasta, brown rice, and whole-grain bread. Eat or drink low-fat dairy products, such as skim milk or low-fat yogurt. Fill one fourth of your plate at each meal with low-fat (lean) proteins. Low-fat proteins include fish, chicken without skin, eggs, beans, and tofu. Avoid fatty meat, cured and processed meat, or chicken with skin. Avoid pre-made or processed food. Eat less than 1,500 mg of salt each day. Do not drink alcohol if: Your doctor tells you not to drink. You are pregnant, may be pregnant, or are planning to become pregnant. If you drink alcohol: Limit how much you use to: 0-1 drink a day for women. 0-2 drinks a day for men. Be aware of how much alcohol is in your drink. In the [place] one drink equals one 12 oz bottle of beer (355 mL), one 5 oz glass of wine (148 mL), or one 1 oz glass of hard liquor (44 mL). Lifestyle  Work with your doctor to stay at a healthy weight or to lose weight. Ask your doctor what the best weight is for you. Get at least 30 minutes of exercise most days of the week. This may include walking, swimming, or biking. Get at least 30 minutes of exercise that strengthens your muscles (resistance exercise) at least 3 days a week. This may include lifting weights or doing Pilates. Do not use any products that contain nicotine or tobacco, such as cigarettes, e-cigarettes, and chewing tobacco. If you need help quitting, ask your doctor. Check your blood pressure at home as told by your doctor. Keep all follow-up visits as told by your doctor. This is important. Medicines Take over-the-counter and prescription medicines only as told by your doctor. Follow directions carefully. Do not skip doses of blood pressure medicine. The medicine does not work as  well if you skip doses. Skipping doses also puts you at risk for problems. Ask your doctor about side effects or reactions to medicines that you should watch for. Contact a doctor if you: Think you are having a reaction to the medicine you are taking. Have headaches that keep coming back (recurring). Feel dizzy. Have swelling in your ankles. Have trouble with your vision. Get help right away if you: Get a very bad headache. Start to feel mixed up (confused). Feel weak or numb. Feel faint. Have very bad pain in your: Chest. Belly (abdomen). Throw up more than once. Have trouble breathing. Summary Hypertension is another name for high blood pressure. High blood pressure forces your heart to work harder to pump blood. For most people, a normal blood pressure is less than 120/80. Making healthy choices can help lower blood pressure. If your blood pressure does not get lower with healthy choices, you may need to take medicine. This information is not intended to replace advice given to you by your health care provider. Make sure you discuss any questions you have with your health care provider. Document Revised: 03/02/2018 Document Reviewed: 03/02/2018 Elsevier Patient Education  2020 [employer].

## 2019-12-21 NOTE — Chronic Care Management (AMB) (Signed)
Chronic Care Management   Initial Visit Note  12/21/2019 Name: Alicia Cherry MRN: [id number] DOB: 1951/02/02  Referred by: Olin Hauser, DO Reason for referral : Chronic Care Management (Initial Outreach: HTN/HLD/CVA- new concerns)   Alicia Cherry is a 69 y.o. year old female who is a primary care patient of Olin Hauser, DO. The CCM team was consulted for assistance with chronic disease management and care coordination needs related to HTN, HLD and memory deficits related to recent CVA  Review of patient status, including review of consultants reports, relevant laboratory and other test results, and collaboration with appropriate care team members and the patient's provider was performed as part of comprehensive patient evaluation and provision of chronic care management services.    SDOH (Social Determinants of Health) assessments performed: Yes See Care Plan activities for detailed interventions related to SDOH  SDOH Interventions     Most Recent Value  SDOH Interventions  Financial Strain Interventions Other (Comment)  [the patient has been out of work since Dec 04, 2019]  Stress Interventions Other (Comment)  [LCSW and careguide referral]  Alcohol Brief Interventions/Follow-up AUDIT Score <7 follow-up not indicated       Medications: Outpatient Encounter Medications as of 12/21/2019  Medication Sig  . atorvastatin (LIPITOR) 80 MG tablet Take 1 tablet (80 mg total) by mouth daily.  Marland Kitchen losartan (COZAAR) 50 MG tablet Take 1 tablet (50 mg total) by mouth daily.   No facility-administered encounter medications on file as of 12/21/2019.     Objective:  BP Readings from Last 3 Encounters:  12/18/19 (!) 140/99  12/03/19 (!) 144/89    Goals Addressed              This Visit's Progress   .  RNCM: Pt- "I don't know what I have" (pt-stated)        CARE PLAN ENTRY (see longtitudinal plan of care for additional care plan information)  Current Barriers:    . 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 Work with the care management team to address educational, disease management, and care coordination needs  . Begin or continue self health monitoring activities as directed today Measure and record blood pressure 1 times per week and adhere to a heart healthy diet . Call provider office for new or worsened signs and symptoms Blood pressure findings outside established parameters and New or worsened symptom related to HLD, stroke or other chronic conditions . Call care management team with questions or concerns . Verbalize basic understanding of patient centered plan of care established today  Interventions related to HTN, HLD, and impaired memory related to recent CVA:  . Evaluation of current treatment plans and patient's adherence to plan as established by provider.  The patient "thought" she was okay and was ready to go back to work; however the neurologist does not feel like she needs to go back until July 9th.  The patient expressed anxiety and frustration because of "[place]" and letters she received today.  Will involve the care guides and LCSW for assistance with helping the patient understand paperwork and fill out as needed.  . Assessed patient understanding of disease states.  The patient verbalized she did not have any issues until she took the "COVID shot" and 2 days later she  was "sick as a dog".  The patient had never had an issue with blood pressure or cholesterol. The patient verbalized she really does not understand what has happened over the last several weeks.  . Assessed patient's education and care coordination needs. The patient needs education and support to help her understand her current state of health and  residual effects of the CVA.  The patient educated on the MyChart functionality and a text sent to the patients for for activation. Will work with the patient on health and wellness goals. Unable to discuss taking blood pressures at home today as the patient was stressed over the mail she received today about her time out of work.  . Provided disease specific education to patient.  Education on following a low sodium/heart healthy diet. The patient verbalized she was not aware that she needed to restrict her sodium or fats in diet. Extensive education given to the patient on diet and care. The patient verbalized she is able to take care of herself but feels a little "overwhelmed" with everything that has happened recently. Empathetic listening and support given. Will send information by the my Chart functionality and in mail until the patient has her my Chart set up.  . Evaluation of memory. The patient had to take a few minutes before she was able to give the [facility] her current address. The patient was not able to tell the RNCM what part of [place] she moved from. During the call the patient would states that she "lost her train of thought" . Collaborated with appropriate clinical care team members regarding patient needs.  LCSW for assistance with memory loss and other concerns . Care guide referral for assistance with resources in [facility] and possible help with paperwork due to memory impairment. . Review of CCM team roles and provided numbers for the patient. The patient is thankful for any support that may be provided.   Patient Self Care Activities related to HTN, HLD, and impaired memory related to recent CVA . Patient is unable to independently self-manage chronic health conditions  Initial goal documentation         Alicia Cherry was given information about Chronic Care Management services today including:  1. CCM service includes personalized support from designated clinical staff  supervised by her physician, including individualized plan of care and coordination with other care providers 2. 24/7 contact phone numbers for assistance for urgent and routine care needs. 3. Service will only be billed when office clinical staff spend 20 minutes or more in a month to coordinate care. 4. Only one practitioner may furnish and bill the service in a calendar month. 5. The patient may stop CCM services at any time (effective at the end of the month) by phone call to the office staff. 6. The patient will be responsible for cost sharing (co-pay) of up to 20% of the service fee (after annual deductible is met).  Patient agreed to services and verbal consent obtained.   Plan:   The care management team will reach out to the patient again over the next 30 days.   Noreene Larsson RN, MSN, [place] Mobile: [phone]

## 2019-12-25 ENCOUNTER — Other Ambulatory Visit: Payer: Self-pay | Admitting: Neurology

## 2019-12-25 DIAGNOSIS — I610 Nontraumatic intracerebral hemorrhage in hemisphere, subcortical: Secondary | ICD-10-CM

## 2019-12-27 ENCOUNTER — Telehealth: Payer: Self-pay | Admitting: Family Medicine

## 2019-12-27 NOTE — Telephone Encounter (Signed)
Received updates from fax today from [facility] on patient's care and reviewed chart with [facility] Neurology and PT/OT.  Neurologist recommended that she follow up with her PCP here for further HTN BP management with goal to keep BP controlled to avoid further complications.  Goal range for BP is 130-140 / 70-80  Please notify patient that we are following up on her BP and ask if she has some recent readings for [place] to review.  Last time I saw her on 12/18/19 we increased her Losartan from 25 to 50mg .  Her next apt is scheduled for end of July 2021.  If her BP readings are >140/90, then she should go ahead and schedule with me sooner within next 2 weeks to adjust her BP medication further at this time as requested by Neurologist.  Preferred in person appointment so we can check her BP. If she is unable to come in, we could do a telephone visit but would need to see her in person at next visit.  August 2021, DO [facility] 12/27/2019, 6:06 PM

## 2019-12-28 NOTE — Telephone Encounter (Signed)
Alright. Also we are coordinating with CCM team to help provide any other resources or contact with patient to help coordinate her care.  [name], DO [facility] 12/28/2019, 2:26 PM

## 2019-12-28 NOTE — Telephone Encounter (Signed)
Patient's apt is scheduled on 01/05/2020 around 9:40 am.

## 2020-01-03 ENCOUNTER — Telehealth: Payer: Self-pay | Admitting: Family Medicine

## 2020-01-03 NOTE — Chronic Care Management (AMB) (Signed)
  Care Management   Note  01/03/2020 Name: Alicia Cherry MRN: [id number] DOB: [birth year]  Alicia Cherry is a 69 y.o. year old female who is a primary care patient of Smitty Cords, DO and is actively engaged with the care management team. I reached out to Starlyn Skeans by phone today to assist with re-scheduling an initial visit with the RN Case Manager.  Follow up plan: Telephone appointment with care management team member scheduled for: 02/01/2020.  Gwenevere Ghazi  Care Guide, Embedded Care Coordination [facility]  [place], [place] [address] Direct Dial: [phone] [email or web address]: [email or web address]

## 2020-01-05 ENCOUNTER — Other Ambulatory Visit: Payer: Self-pay

## 2020-01-05 ENCOUNTER — Ambulatory Visit (INDEPENDENT_AMBULATORY_CARE_PROVIDER_SITE_OTHER): Payer: BC Managed Care – PPO | Admitting: Family Medicine

## 2020-01-05 ENCOUNTER — Encounter: Payer: Self-pay | Admitting: Family Medicine

## 2020-01-05 ENCOUNTER — Ambulatory Visit
Admission: RE | Admit: 2020-01-05 | Discharge: 2020-01-05 | Disposition: A | Discharge status: Home / Self Care | Payer: BC Managed Care – PPO | Source: Ambulatory Visit | Attending: Neurology | Admitting: Neurology

## 2020-01-05 VITALS — BP 138/83 | HR 80 | Temp 97.3°F | Resp 16 | Ht 61.0 in | Wt 153.6 lb

## 2020-01-05 DIAGNOSIS — I6930 Unspecified sequelae of cerebral infarction: Secondary | ICD-10-CM | POA: Diagnosis not present

## 2020-01-05 DIAGNOSIS — I10 Essential (primary) hypertension: Secondary | ICD-10-CM | POA: Diagnosis not present

## 2020-01-05 DIAGNOSIS — I610 Nontraumatic intracerebral hemorrhage in hemisphere, subcortical: Secondary | ICD-10-CM

## 2020-01-05 DIAGNOSIS — E782 Mixed hyperlipidemia: Secondary | ICD-10-CM | POA: Diagnosis not present

## 2020-01-05 DIAGNOSIS — I69319 Unspecified symptoms and signs involving cognitive functions following cerebral infarction: Secondary | ICD-10-CM

## 2020-01-05 MED ORDER — ATORVASTATIN CALCIUM 80 MG PO TABS: 80.0000 mg | ORAL_TABLET | Freq: Every day | ORAL | 1 refills | Status: AC

## 2020-01-05 NOTE — Patient Instructions (Addendum)
Thank you for coming to the office today.  Try to get a BP cuff today at pharmacy or [facility] if interested.  Keep a close watch on the Blood Pressure - BP goal < 140/90.  You have refills on Losartan.  I have re ordered Atorvastatin now for 90 day  Recommend 2nd dose Moderna vaccine when you are ready in July  02/02/20 - with me here at Alicia Cherry - Dr Althea Charon   (Call them to check if you still need this one or not - you do see Dr Sherryll Burger instead 03/05/20   Hal Hope, MD  Neurology  [address]  [place] [address]    Phone: [phone]  (important appointment) 03/13/20  [name], [name], MD  Neurosurgery  [address] [facility] [address]  [place] [address] Phone: [phone]   03/22/20 Dr Sherryll Burger Neurology   Please schedule a Follow-up Appointment to: Return in about 4 weeks (around 02/02/2020) for keep apt with me in 1 month for blood pressure check.  If you have any other questions or concerns, please feel free to call the office or send a message through MyChart. You may also schedule an earlier appointment if necessary.  Additionally, you may be receiving a survey about your experience at our office within a few days to 1 week by e-mail or mail. We value your feedback.  [name], DO [facility], [place]

## 2020-01-05 NOTE — Progress Notes (Signed)
Subjective:    Patient ID: Alicia Cherry, female    DOB: 07/17/1950, 69 y.o.   MRN: [id number]  Alicia Cherry is a 69 y.o. 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 She has since seen [facility] Neurology Dr Sherryll Burger for initial neuro outpatient consult on 12/21/19. She has done Physical therapy as well at multiple visits. - she has had imaging and this was reviewed by neurology. Ultimately she was advised to keep on HTN regimen to control BP < 140/90 and Atorvastatin for future stroke reduction risk. She was written out of work by Neurology for 3 weeks additionally, advised to return on 01/12/20, and scheduled future repeat head CT due to hematoma and has upcoming neurosurgery follow-up with [facility]  Today she is here to follow-up prior to returning to work next week. She feels overall seems improved but she still expresses frustration and concern about returning to work, she wants to work but is unsure how it will go until she starts back  She is not checking her BP outside office. She will look into BP cuff options.  Taking medication without problem.  She asks about upcoming appointments.  Needs refill on Atorvastatin did not pick up last rx from pharmacy still had it leftover from hospital.  03/22/20 Dr Sherryll Burger Neurology  03/05/20   Hal Hope, MD  Neurology  [address] [place] [address]    Phone: [phone]  03/13/20  [name], [name], MD  Neurosurgery  [address] [facility] [address]  [place] [address]    Phone: [phone]     She asks about Moderna dose 2nd upcoming now, missed dose.   Depression screen [facility] 2/9 01/05/2020 12/21/2019 12/18/2019  Decreased Interest 0 0 0  Down, Depressed, Hopeless  0 0 0  PHQ - 2 Score 0 0 0    Social History   Tobacco Use  . Smoking status: Never Smoker  . Smokeless tobacco: Never Used  Substance Use Topics  . Alcohol use: Not Currently  . Drug use: Never    Review of Systems Per HPI unless specifically indicated above     Objective:    BP 138/83   Pulse 80   Temp (!) 97.3 F (36.3 C) (Temporal)   Resp 16   Ht 5\' 1"  (1.549 m)   Wt 153 lb 9.6 oz (69.7 kg)   SpO2 97%   BMI 29.02 kg/m   Wt Readings from Last 3 Encounters:  01/05/20 153 lb 9.6 oz (69.7 kg)  12/18/19 154 lb 12.8 oz (70.2 kg)  12/03/19 140 lb (63.5 kg)    Physical Exam Vitals and nursing note reviewed.  Constitutional:      General: She is not in acute distress.    Appearance: She is well-developed. She is not diaphoretic.     Comments: Well-appearing, comfortable, cooperative  HENT:     Head: Normocephalic and atraumatic.  Eyes:     General:        Right eye: No discharge.        Left eye: No discharge.     Conjunctiva/sclera: Conjunctivae normal.  Neck:     Thyroid: No thyromegaly.  Cardiovascular:     Rate and Rhythm: Normal rate and regular rhythm.  Heart sounds: Normal heart sounds. No murmur heard.   Pulmonary:     Effort: Pulmonary effort is normal. No respiratory distress.     Breath sounds: Normal breath sounds. No wheezing or rales.  Musculoskeletal:        General: Normal range of motion.     Cervical back: Normal range of motion and neck supple.  Lymphadenopathy:     Cervical: No cervical adenopathy.  Skin:    General: Skin is warm and dry.     Findings: No erythema or rash.  Neurological:     General: No focal deficit present.     Mental Status: She is alert and oriented to person, place, and time.     Cranial Nerves: No cranial nerve deficit.     Sensory: No sensory deficit.     Motor: No weakness.  Psychiatric:        Behavior: Behavior normal.     Comments: Well groomed, good eye contact, normal speech and thoughts. Short  term and functional memory is improved today. She is following conversation better and not having to repeat questions and she has better recall of past events.      Results for orders placed or performed during the hospital encounter of 12/03/19  SARS Coronavirus 2 by RT PCR (hospital order, performed in [facility] hospital lab) Nasopharyngeal Nasopharyngeal Swab   Specimen: Nasopharyngeal Swab  Result Value Ref Range   SARS Coronavirus 2 NEGATIVE NEGATIVE  Basic metabolic panel  Result Value Ref Range   Sodium 140 135 - 145 mmol/L   Potassium 3.4 (L) 3.5 - 5.1 mmol/L   Chloride 104 98 - 111 mmol/L   CO2 28 22 - 32 mmol/L   Glucose, Bld 88 70 - 99 mg/dL   BUN 16 8 - 23 mg/dL   Creatinine, Ser 7.85 (H) 0.44 - 1.00 mg/dL   Calcium 8.7 (L) 8.9 - 10.3 mg/dL   GFR calc non Af Amer 50 (L) >60 mL/min   GFR calc Af Amer 58 (L) >60 mL/min   Anion gap 8 5 - 15  CBC  Result Value Ref Range   WBC 6.4 4.0 - 10.5 K/uL   RBC 4.47 3.87 - 5.11 MIL/uL   Hemoglobin 12.7 12.0 - 15.0 g/dL   HCT 88.5 36 - 46 %   MCV 88.8 80.0 - 100.0 fL   MCH 28.4 26.0 - 34.0 pg   MCHC 32.0 30.0 - 36.0 g/dL   RDW 02.7 74.1 - 28.7 %   Platelets 274 150 - 400 K/uL   nRBC 0.0 0.0 - 0.2 %  Urinalysis, Complete w Microscopic  Result Value Ref Range   Color, Urine COLORLESS (A) YELLOW   APPearance CLEAR (A) CLEAR   Specific Gravity, Urine 1.005 1.005 - 1.030   pH 8.0 5.0 - 8.0   Glucose, UA NEGATIVE NEGATIVE mg/dL   Hgb urine dipstick NEGATIVE NEGATIVE   Bilirubin Urine NEGATIVE NEGATIVE   Ketones, ur NEGATIVE NEGATIVE mg/dL   Protein, ur NEGATIVE NEGATIVE mg/dL   Nitrite NEGATIVE NEGATIVE   Leukocytes,Ua NEGATIVE NEGATIVE   RBC / HPF 0-5 0 - 5 RBC/hpf   WBC, UA 0-5 0 - 5 WBC/hpf   Bacteria, UA NONE SEEN NONE SEEN   Squamous Epithelial / LPF 0-5 0 - 5  Urine Drug Screen, Qualitative  Result Value Ref Range   Tricyclic, Ur Screen NONE DETECTED NONE DETECTED   Amphetamines, Ur Screen NONE DETECTED NONE  DETECTED   MDMA (Ecstasy)Ur Screen NONE  DETECTED NONE DETECTED   Cocaine Metabolite,Ur [place] NONE DETECTED NONE DETECTED   Opiate, Ur Screen NONE DETECTED NONE DETECTED   Phencyclidine (PCP) Ur S NONE DETECTED NONE DETECTED   Cannabinoid 50 Ng, Ur [place] NONE DETECTED NONE DETECTED   Barbiturates, Ur Screen NONE DETECTED NONE DETECTED   Benzodiazepine, Ur Scrn NONE DETECTED NONE DETECTED   Methadone Scn, Ur NONE DETECTED NONE DETECTED      Assessment & Plan:   Problem List Items Addressed This Visit    Mixed hyperlipidemia   Relevant Medications   atorvastatin (LIPITOR) 80 MG tablet   History of cerebrovascular accident (CVA) with residual deficit - Primary   Essential hypertension   Relevant Medications   atorvastatin (LIPITOR) 80 MG tablet   Cognitive deficit due to recent cerebrovascular accident (CVA)   Relevant Medications   atorvastatin (LIPITOR) 80 MG tablet      Gradual improving memory loss from recent hemorrhagic stroke / HTN related Complex constellation of problems following  recent acute thalamic hemorrhagic stroke Likely secondary to HTN, possible AVM, had untreated HTN and was on ASA New neurological deficit with this hospitalization as she did not have known prior deficits, some moderate white matter changes on MRI Images reviewed Hospital record from [facility] reviewed, Neuro/Neurosurgery consultation  Improved SBP today with BP 138 Discussion on HTN management importance as requested by Neurology goal < 140/90 She has not been able to check BP at home, but was advised to look into getting a cuff Offered to adjust her current medication, however given the improvement, and she has nearly 90 day supply of current med, we agree to continue current dose for now - Continue Losartan 50mg  daily - Continue / refill Atorvastatin , ASCVD secondary prevention Remain OFF Aspirin  Follow with [facility] - they have already made arrangements for her to return to work on  01/12/20 or after.  Advised her to get 2nd dose Moderna COVID vaccine anytime now. Unlikely to have caused her problem.  Keep upcoming f/u with therapy as indicated  Future f/u with Neurosurgery as scheduled already.  She has demonstrated improvement in ability to keep up with doctors appointments, drive safely to her appointments. She is now waiting to see how she will do at work but no new concerns determined today. Unfortunately she does not have a good support system in [place] as her family is out of state  Already followed by CCM team for additional assistance.  Meds ordered this encounter  Medications  . atorvastatin (LIPITOR) 80 MG tablet    Sig: Take 1 tablet (80 mg total) by mouth daily.    Dispense:  90 tablet    Refill:  1     Follow up plan: Return in about 4 weeks (around 02/02/2020) for keep apt with me in 1 month for blood pressure check.   02/04/2020, DO [facility] 01/05/2020, 10:08 AM

## 2020-01-22 ENCOUNTER — Telehealth: Payer: BC Managed Care – PPO

## 2020-01-30 ENCOUNTER — Telehealth: Payer: Self-pay

## 2020-01-30 ENCOUNTER — Other Ambulatory Visit: Payer: Self-pay | Admitting: Family Medicine

## 2020-01-30 DIAGNOSIS — I10 Essential (primary) hypertension: Secondary | ICD-10-CM

## 2020-01-30 NOTE — Telephone Encounter (Signed)
Copied from CRM [id number]. Topic: Referral - Status >> Jan 30, 2020  3:16 PM Ricarda Frame D wrote: 01/30/20 Spoke with patient she is back and work and does not need any community resources right now.  Closing referral. Olean Ree [phone]

## 2020-01-30 NOTE — Telephone Encounter (Signed)
Requested Prescriptions  Pending Prescriptions Disp Refills  . losartan (COZAAR) 50 MG tablet [Pharmacy Med Name: Losartan Potassium 50 MG Oral Tablet] 90 tablet 0    Sig: Take 1 tablet by mouth once daily     Cardiovascular:  Angiotensin Receptor Blockers Failed - 01/30/2020  5:44 PM      Failed - Cr in normal range and within 180 days    Creatinine, Ser  Date Value Ref Range Status  12/03/2019 1.14 (H) 0.44 - 1.00 mg/dL Final         Failed - K in normal range and within 180 days    Potassium  Date Value Ref Range Status  12/03/2019 3.4 (L) 3.5 - 5.1 mmol/L Final         Passed - Patient is not pregnant      Passed - Last BP in normal range    BP Readings from Last 1 Encounters:  01/05/20 138/83         Passed - Valid encounter within last 6 months    Recent Outpatient Visits          3 weeks ago History of cerebrovascular accident (CVA) with residual deficit   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

## 2020-01-30 NOTE — Telephone Encounter (Signed)
Copied from CRM [id number]. Topic: Referral - Status >> Jan 30, 2020  2:39 PM Ricarda Frame D wrote: 01/30/20 Left message on voicemail for patient to return my call regarding resource for food insecurity. Olean Ree [phone]

## 2020-02-01 ENCOUNTER — Telehealth: Payer: BC Managed Care – PPO | Admitting: General Practice

## 2020-02-01 ENCOUNTER — Ambulatory Visit: Payer: Self-pay | Admitting: General Practice

## 2020-02-01 DIAGNOSIS — I6930 Unspecified sequelae of cerebral infarction: Secondary | ICD-10-CM

## 2020-02-01 DIAGNOSIS — I69319 Unspecified symptoms and signs involving cognitive functions following cerebral infarction: Secondary | ICD-10-CM

## 2020-02-01 DIAGNOSIS — I10 Essential (primary) hypertension: Secondary | ICD-10-CM

## 2020-02-01 DIAGNOSIS — E782 Mixed hyperlipidemia: Secondary | ICD-10-CM

## 2020-02-01 NOTE — Patient Instructions (Signed)
Visit Information  Goals Addressed              This Visit's Progress   .  RNCM: Pt- "I don't know what I have" (pt-stated)        CARE PLAN ENTRY (see longtitudinal plan of care for additional care plan information)  Current Barriers:  . 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 Work with the care management team to address educational, disease management, and care coordination needs  . Begin or continue self health monitoring activities as directed today Measure and record blood pressure 1 times per week and adhere to a heart healthy diet . Call provider office for new or worsened signs and symptoms Blood pressure findings outside established parameters and New or worsened symptom related to HLD, stroke or other chronic conditions . Call care management team with questions or concerns . Verbalize basic understanding of patient centered plan of care established today  Interventions related to HTN, HLD, and impaired memory related to recent CVA:  . Evaluation of current treatment plans and patient's adherence to plan as established by provider.  The patient "thought" she was okay and was ready to go back to work; however the neurologist does not feel like she needs to go back until July 9th.  The patient expressed anxiety and frustration because of "[place]" and letters she received today.  Will involve the care guides and LCSW for assistance with helping the patient understand paperwork and fill out as needed.  o 02-01-2020: the patient has been back at work for 3 weeks. She is not always in the original position she applied for. She has some frustrations related to being back at work because she doesn't feel like she is getting the support she needs but she is doing well.  She has forgotten things and states that she has to ask for help sometimes. She says it is different and she will likely just deal with it a little longer or quit. She may consider a new job. Empathetic listening and support given.  . Assessed patient understanding of disease states.  The patient verbalized she did not have any issues until she took the "COVID shot" and 2 days later she was "sick as a dog".  The patient had never had an issue with blood pressure or cholesterol. The patient verbalized she really does not understand what has happened over the last several weeks.  o 02-01-2020: The patient seems to better at recalling information. The patient remembered talking to the [facility] before. She has never had to take medications before but endorses taking medications as prescribed. The patient denies any new concerns at this time.  . Assessed patient's education and care coordination needs. The patient needs education and support to help her understand her current state of health and residual effects of the CVA.  The patient educated on the MyChart functionality and a text sent to the patients for for activation. Will work with the patient on health and wellness goals. Unable to discuss taking blood pressures at home today as the patient was stressed over the mail she received today about her time out of work. 02-01-2020: Review of the patients blood pressure at the MD visit.  She feels she is doing well over al with her health and  well being.  . Provided disease specific education to patient.  Education on following a low sodium/heart healthy diet. The patient verbalized she was not aware that she needed to restrict her sodium or fats in diet. Extensive education given to the patient on diet and care. The patient verbalized she is able to take care of herself but feels a little "overwhelmed" with everything that has happened recently. Empathetic listening and support given. Will send information by the my  Chart functionality and in mail until the patient has her my Chart set up.  . Evaluation of memory. The patient had to take a few minutes before she was able to give the [facility] her current address. The patient was not able to tell the RNCM what part of [place] she moved from. During the call the patient would states that she "lost her train of thought".  02-01-2020: The patient can recall better than the outreach. The RNCM can tell improvement with memory and recall. The patient talked mainly about the frustrations she has had at work when she can not recall things and how to do them.  Steele Sizer with appropriate clinical care team members regarding patient needs.  LCSW for assistance with memory loss and other concerns . Care guide referral for assistance with resources in [facility] and possible help with paperwork due to memory impairment. Completed- the patient has no new needs from care guides.  . Review of CCM team roles and provided numbers for the patient. The patient is thankful for any support that may be provided.   Patient Self Care Activities related to HTN, HLD, and impaired memory related to recent CVA . Patient is unable to independently self-manage chronic health conditions  Please see past updates related to this goal by clicking on the "Past Updates" button in the selected goal         Patient verbalizes understanding of instructions provided today.   Telephone follow up appointment with care management team member scheduled for: 03-28-2020: 1045  Pam Arlana Pouch RN, MSN, CCM Community Care Coordinator [facility]  [facility] [place] Mobile: [phone]

## 2020-02-01 NOTE — Chronic Care Management (AMB) (Signed)
Care Management   Follow Up Note   02/01/2020 Name: Nichole Neyer MRN: [id number] DOB: 1951-01-16  Referred by: Smitty Cords, DO Reason for referral : Care Coordination (RNCM Follow up call for Chronic Disease Management and Care Coordination Needs)   Shastina Rua is a 69 y.o. year old female who is a primary care patient of Smitty Cords, DO. The care management team was consulted for assistance with care management and care coordination needs.    Review of patient status, including review of consultants reports, relevant laboratory and other test results, and collaboration with appropriate care team members and the patient's provider was performed as part of comprehensive patient evaluation and provision of chronic care management services.    SDOH (Social Determinants of Health) assessments performed: Yes See Care Plan activities for detailed interventions related to [facility])     Advanced Directives: See Care Plan and Vynca application for related entries.   Goals Addressed              This Visit's Progress   .  RNCM: Pt- "I don't know what I have" (pt-stated)        CARE PLAN ENTRY (see longtitudinal plan of care for additional care plan information)  Current Barriers:  . 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 Work with the care management team to address educational, disease management, and care coordination needs  . Begin or continue self health monitoring activities as directed today Measure and record blood pressure 1 times per week and adhere to a heart healthy diet . Call provider office for new or worsened signs and symptoms Blood pressure findings outside established parameters and New or worsened symptom related to HLD,  stroke or other chronic conditions . Call care management team with questions or concerns . Verbalize basic understanding of patient centered plan of care established today  Interventions related to HTN, HLD, and impaired memory related to recent CVA:  . Evaluation of current treatment plans and patient's adherence to plan as established by provider.  The patient "thought" she was okay and was ready to go back to work; however the neurologist does not feel like she needs to go back until July 9th.  The patient expressed anxiety and frustration because of "[place]" and letters she received today.  Will involve the care guides and LCSW for assistance with helping the patient understand paperwork and fill out as needed.  o 02-01-2020: the patient has been back at work for 3 weeks. She is not always in the original position she applied for. She has some frustrations related to being back at work because she doesn't feel like she is getting the support she needs but she is doing well. She has forgotten things and states that she has to ask for help sometimes. She says it is different and she will likely just deal with it a little longer or quit. She may consider a new job. Empathetic listening and support given.  . Assessed patient understanding of disease states.  The patient verbalized she did not have any issues until she took the "COVID shot" and 2 days later she was "sick as a dog".  The patient had never had an issue with blood pressure or cholesterol. The patient verbalized she really does not understand  what has happened over the last several weeks.  o 02-01-2020: The patient seems to better at recalling information. The patient remembered talking to the [facility] before. She has never had to take medications before but endorses taking medications as prescribed. The patient denies any new concerns at this time.  . Assessed patient's education and care coordination needs. The patient needs education and  support to help her understand her current state of health and residual effects of the CVA.  The patient educated on the MyChart functionality and a text sent to the patients for for activation. Will work with the patient on health and wellness goals. Unable to discuss taking blood pressures at home today as the patient was stressed over the mail she received today about her time out of work. 02-01-2020: Review of the patients blood pressure at the MD visit.  She feels she is doing well over al with her health and well being.  . Provided disease specific education to patient.  Education on following a low sodium/heart healthy diet. The patient verbalized she was not aware that she needed to restrict her sodium or fats in diet. Extensive education given to the patient on diet and care. The patient verbalized she is able to take care of herself but feels a little "overwhelmed" with everything that has happened recently. Empathetic listening and support given. Will send information by the my Chart functionality and in mail until the patient has her my Chart set up.  . Evaluation of memory. The patient had to take a few minutes before she was able to give the [facility] her current address. The patient was not able to tell the RNCM what part of [place] she moved from. During the call the patient would states that she "lost her train of thought".  02-01-2020: The patient can recall better than the outreach. The RNCM can tell improvement with memory and recall. The patient talked mainly about the frustrations she has had at work when she can not recall things and how to do them.  Steele Sizer with appropriate clinical care team members regarding patient needs.  LCSW for assistance with memory loss and other concerns . Care guide referral for assistance with resources in [facility] and possible help with paperwork due to memory impairment. Completed- the patient has no new needs from care guides.  . Review of CCM team roles  and provided numbers for the patient. The patient is thankful for any support that may be provided.   Patient Self Care Activities related to HTN, HLD, and impaired memory related to recent CVA . Patient is unable to independently self-manage chronic health conditions  Please see past updates related to this goal by clicking on the "Past Updates" button in the selected goal          Telephone follow up appointment with care management team member scheduled for: 03-28-2020 at 1045  [facility] RN, MSN, CCM Community Care Coordinator [facility]  [facility] [employer] Mobile: [phone]

## 2020-02-02 ENCOUNTER — Ambulatory Visit: Payer: BC Managed Care – PPO | Admitting: Family Medicine

## 2020-02-13 ENCOUNTER — Ambulatory Visit: Payer: BC Managed Care – PPO | Admitting: Licensed Clinical Social Worker

## 2020-02-13 NOTE — Chronic Care Management (AMB) (Signed)
  Care Management   Follow Up Note   02/13/2020 Name: Alicia Cherry MRN: [id number] DOB: 01-05-1951  Referred by: Smitty Cords, DO Reason for referral : No chief complaint on file.   Alicia Cherry is a 69 y.o. year old female who is a primary care patient of Smitty Cords, DO. The care management team was consulted for assistance with care management and care coordination needs.    Review of patient status, including review of consultants reports, relevant laboratory and other test results, and collaboration with appropriate care team members and the patient's provider was performed as part of comprehensive patient evaluation and provision of chronic care management services.    LCSW completed initial CCM Social Work outreach and was able to reach patient successfully. Patient reports that she is currently at work right now but can talk. She denies having any current social work or Publishing rights manager related concerns at this time but was appreciative of call and support that was given. Patient declined [employer] as well. Patient reports that her main focus right now is managing her health as best as possible. Patient states that she feels that her health has improved over the last month. LCSW provided patient with her contact information and number. Patient is agreeable to let CCM RNCM or PCP know if any future social work concerns arise so that can LCSW can get involved.   Dickie La, BSW, MSW, LCSW [facility]  [facility] [email or web address] Phone: [phone]

## 2020-03-28 ENCOUNTER — Telehealth: Payer: BC Managed Care – PPO | Admitting: General Practice

## 2020-03-28 ENCOUNTER — Ambulatory Visit: Payer: Self-pay | Admitting: General Practice

## 2020-03-28 DIAGNOSIS — I69319 Unspecified symptoms and signs involving cognitive functions following cerebral infarction: Secondary | ICD-10-CM

## 2020-03-28 DIAGNOSIS — E782 Mixed hyperlipidemia: Secondary | ICD-10-CM

## 2020-03-28 DIAGNOSIS — I10 Essential (primary) hypertension: Secondary | ICD-10-CM

## 2020-03-28 DIAGNOSIS — I6930 Unspecified sequelae of cerebral infarction: Secondary | ICD-10-CM

## 2020-03-28 NOTE — Chronic Care Management (AMB) (Signed)
Care Management   Follow Up Note   03/28/2020 Name: Alicia Cherry MRN: [id number] DOB: [birth year]  Referred by: Smitty Cords, DO Reason for referral : Care Coordination (Follow up: Chronic Disease Management and Care Coordination Needs)   Alicia Cherry is a 69 y.o. year old female who is a primary care patient of Smitty Cords, DO. The care management team was consulted for assistance with care management and care coordination needs.    Review of patient status, including review of consultants reports, relevant laboratory and other test results, and collaboration with appropriate care team members and the patient's provider was performed as part of comprehensive patient evaluation and provision of chronic care management services.    SDOH (Social Determinants of Health) assessments performed: Yes See Care Plan activities for detailed interventions related to [facility])     Advanced Directives: See Care Plan and Vynca application for related entries.   Goals Addressed              This Visit's Progress   .  RNCM: Pt- "I don't know what I have" (pt-stated)        CARE PLAN ENTRY (see longtitudinal plan of care for additional care plan information)  Current Barriers:  . 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 Work with the care management team to address educational, disease management, and care coordination needs  . Begin or continue self health monitoring activities as directed today Measure and record blood pressure 1 times per week and adhere to a heart healthy diet . Call provider office for new or worsened signs and symptoms Blood pressure findings outside established parameters and New or worsened symptom related to HLD, stroke or other  chronic conditions . Call care management team with questions or concerns . Verbalize basic understanding of patient centered plan of care established today  Interventions related to HTN, HLD, and impaired memory related to recent CVA:  . Evaluation of current treatment plans and patient's adherence to plan as established by provider.   o 03-28-2020: The patient is back at work and seems to be doing well for the most part. She says her biggest issue right now is remembering names. She has started writing names down and actually sometimes can remember then and sometimes can't and has to look up the names. These are actually people she works with or sees on a regular basis. This is very frustrating at times. She saw the neurologist on 03-21-2020 but forgot to ask about this. She does not go back to neurology for a year unless she has new concerns. Educated the patient on writing down questions to ask the provider.  . Assessed patient understanding of disease states.  The patient verbalized she did not have any issues until she took the "COVID shot" and 2 days later she was "sick as a dog".  The patient had never had an issue with blood pressure or cholesterol. The patient verbalized she really does not understand what has happened over the last several weeks.  o 03-28-2020: The patient seems to better at recalling information. The patient remembered talking to the [facility] before. She has never had to take medications before but endorses taking medications as prescribed. The patient denies any new concerns at this time. The patient feels overall  things are improved. RNCM acknowledged positive changes in memory recall noted since working with the patient. The patient was thankful for the information shared. The patient was working today so she did not have long to talk.  She is thankful for the touch base calls.   . Assessed patient's education and care coordination needs. The patient needs education and support to  help her understand her current state of health and residual effects of the CVA.  The patient educated on the MyChart functionality and a text sent to the patients for for activation. Will work with the patient on health and wellness goals. 03-28-2020: The patient states that she does not take her blood pressure at home but has the ability to take it. Education on sx/sx of HTN and to start taking and recording blood pressure at least 1 time a week and also to take it when she feels different. Discussed headaches sometimes meaning her blood pressure may be elevated.  . Provided disease specific education to patient.  Education on following a low sodium/heart healthy diet. The patient verbalized she was not aware that she needed to restrict her sodium or fats in diet. Extensive education given to the patient on diet and care. The patient verbalized she is able to take care of herself but feels a little "overwhelmed" with everything that has happened recently. Empathetic listening and support given. Will send information by the my Chart functionality and in mail until the patient has her my Chart set up. 03-28-2020: The patient seems more at ease with what has happened over the last several months. She received her COVID 19 2nd shot on 01-05-2020. [facility] called and updated lot information and dates received. The immunization record has been updated to reflect vaccination information.  . Evaluation of memory. The patient had to take a few minutes before she was able to give the [facility] her current address. The patient was not able to tell the RNCM what part of [place] moved from. During the call the patient would states that she "lost her train of thought".  03-28-2020: The patient can recall better than the initial outreach or even subsequent outreaches. The RNCM can tell improvement with memory and recall. The patient talked mainly about the frustrations she has had at work when she can not recall things or peoples  names. She states she is adjusting.  Steele Sizer with appropriate clinical care team members regarding patient needs.  LCSW for assistance with memory loss and other concerns . Review of CCM team roles and provided numbers for the patient. The patient is thankful for any support that may be provided.   Patient Self Care Activities related to HTN, HLD, and impaired memory related to recent CVA . Patient is unable to independently self-manage chronic health conditions  Please see past updates related to this goal by clicking on the "Past Updates" button in the selected goal          Telephone follow up appointment with care management team member scheduled for:06-06-2020 at 1130 am  Alto Denver RN, MSN, CCM Community Care Coordinator [facility]  [facility] [place] Mobile: [phone]

## 2020-03-28 NOTE — Patient Instructions (Signed)
Visit Information  Goals Addressed              This Visit's Progress   .  RNCM: Pt- "I don't know what I have" (pt-stated)        CARE PLAN ENTRY (see longtitudinal plan of care for additional care plan information)  Current Barriers:  . 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 Work with the care management team to address educational, disease management, and care coordination needs  . Begin or continue self health monitoring activities as directed today Measure and record blood pressure 1 times per week and adhere to a heart healthy diet . Call provider office for new or worsened signs and symptoms Blood pressure findings outside established parameters and New or worsened symptom related to HLD, stroke or other chronic conditions . Call care management team with questions or concerns . Verbalize basic understanding of patient centered plan of care established today  Interventions related to HTN, HLD, and impaired memory related to recent CVA:  . Evaluation of current treatment plans and patient's adherence to plan as established by provider.   o 03-28-2020: The patient is back at work and seems to be doing well for the most part. She says her biggest issue right now is remembering names. She has started writing names down and actually sometimes can remember then and sometimes can't and has to look up the names. These are actually people she works with or sees on a regular basis. This is very frustrating at times. She saw the neurologist on 03-21-2020 but forgot to ask about this. She does not go back to neurology for a year unless she has new concerns. Educated the patient on writing down questions to ask the provider.  . Assessed patient understanding of disease states.   The patient verbalized she did not have any issues until she took the "COVID shot" and 2 days later she was "sick as a dog".  The patient had never had an issue with blood pressure or cholesterol. The patient verbalized she really does not understand what has happened over the last several weeks.  o 03-28-2020: The patient seems to better at recalling information. The patient remembered talking to the [facility] before. She has never had to take medications before but endorses taking medications as prescribed. The patient denies any new concerns at this time. The patient feels overall things are improved. RNCM acknowledged positive changes in memory recall noted since working with the patient. The patient was thankful for the information shared. The patient was working today so she did not have long to talk.  She is thankful for the touch base calls.   . Assessed patient's education and care coordination needs. The patient needs education and support to help her understand her current state of health and residual effects of the CVA.  The patient educated on the MyChart functionality and a text sent to the patients for for activation. Will work with the patient on health and wellness goals. 03-28-2020: The patient states that she does not take her blood pressure at home but has the ability to take it. Education on sx/sx of HTN and to start taking and recording blood pressure at least 1 time a week and also to take it when she feels different. Discussed  headaches sometimes meaning her blood pressure may be elevated.  . Provided disease specific education to patient.  Education on following a low sodium/heart healthy diet. The patient verbalized she was not aware that she needed to restrict her sodium or fats in diet. Extensive education given to the patient on diet and care. The patient verbalized she is able to take care of herself but feels a little "overwhelmed" with everything that has happened recently. Empathetic  listening and support given. Will send information by the my Chart functionality and in mail until the patient has her my Chart set up. 03-28-2020: The patient seems more at ease with what has happened over the last several months. She received her COVID 19 2nd shot on 01-05-2020. [facility] called and updated lot information and dates received. The immunization record has been updated to reflect vaccination information.  . Evaluation of memory. The patient had to take a few minutes before she was able to give the [facility] her current address. The patient was not able to tell the RNCM what part of [place] she moved from. During the call the patient would states that she "lost her train of thought".  03-28-2020: The patient can recall better than the initial outreach or even subsequent outreaches. The RNCM can tell improvement with memory and recall. The patient talked mainly about the frustrations she has had at work when she can not recall things or peoples names. She states she is adjusting.  Steele Sizer with appropriate clinical care team members regarding patient needs.  LCSW for assistance with memory loss and other concerns . Review of CCM team roles and provided numbers for the patient. The patient is thankful for any support that may be provided.   Patient Self Care Activities related to HTN, HLD, and impaired memory related to recent CVA . Patient is unable to independently self-manage chronic health conditions  Please see past updates related to this goal by clicking on the "Past Updates" button in the selected goal         Patient verbalizes understanding of instructions provided today.   Telephone follow up appointment with care management team member scheduled for: 06-06-2020 at 11:30 am  Alto Denver RN, MSN, CCM Community Care Coordinator [facility]  [facility] [place] Mobile: [phone]

## 2020-05-18 ENCOUNTER — Emergency Department: Payer: BC Managed Care – PPO

## 2020-05-18 ENCOUNTER — Encounter: Payer: Self-pay | Admitting: Emergency Medicine

## 2020-05-18 ENCOUNTER — Other Ambulatory Visit: Payer: Self-pay

## 2020-05-18 ENCOUNTER — Emergency Department
Admission: EM | Admit: 2020-05-18 | Discharge: 2020-05-19 | Disposition: A | Discharge status: Home / Self Care | Payer: BC Managed Care – PPO | Attending: Emergency Medicine | Admitting: Emergency Medicine

## 2020-05-18 DIAGNOSIS — I10 Essential (primary) hypertension: Secondary | ICD-10-CM | POA: Insufficient documentation

## 2020-05-18 DIAGNOSIS — R519 Headache, unspecified: Secondary | ICD-10-CM | POA: Diagnosis present

## 2020-05-18 DIAGNOSIS — J069 Acute upper respiratory infection, unspecified: Secondary | ICD-10-CM | POA: Diagnosis not present

## 2020-05-18 DIAGNOSIS — Z79899 Other long term (current) drug therapy: Secondary | ICD-10-CM | POA: Insufficient documentation

## 2020-05-18 DIAGNOSIS — J398 Other specified diseases of upper respiratory tract: Secondary | ICD-10-CM

## 2020-05-18 HISTORY — DX: Nontraumatic intracerebral hemorrhage, unspecified: I61.9

## 2020-05-18 HISTORY — DX: Essential (primary) hypertension: I10

## 2020-05-18 LAB — CBC
HCT: 40.1 % (ref 36.0–46.0)
Hemoglobin: 12.9 g/dL (ref 12.0–15.0)
MCH: 28.5 pg (ref 26.0–34.0)
MCHC: 32.2 g/dL (ref 30.0–36.0)
MCV: 88.5 fL (ref 80.0–100.0)
Platelets: 263 10*3/uL (ref 150–400)
RBC: 4.53 MIL/uL (ref 3.87–5.11)
RDW: 13.9 % (ref 11.5–15.5)
WBC: 7.7 10*3/uL (ref 4.0–10.5)
nRBC: 0 % (ref 0.0–0.2)

## 2020-05-18 LAB — BASIC METABOLIC PANEL
Anion gap: 12 (ref 5–15)
BUN: 11 mg/dL (ref 8–23)
CO2: 28 mmol/L (ref 22–32)
Calcium: 10.1 mg/dL (ref 8.9–10.3)
Chloride: 100 mmol/L (ref 98–111)
Creatinine, Ser: 1.16 mg/dL — ABNORMAL HIGH (ref 0.44–1.00)
GFR, Estimated: 52 mL/min — ABNORMAL LOW (ref >60)
Glucose, Bld: 116 mg/dL — ABNORMAL HIGH (ref 70–99)
Potassium: 3.8 mmol/L (ref 3.5–5.1)
Sodium: 140 mmol/L (ref 135–145)

## 2020-05-18 LAB — TROPONIN I (HIGH SENSITIVITY): Troponin I (High Sensitivity): 4 ng/L (ref <18)

## 2020-05-18 NOTE — ED Triage Notes (Addendum)
Patient states that she checked her blood pressure tonight and it was 191/120. Patient states that she checks her blood pressure every few days because she has hypertension. Patient states that in May she had a brain bleed due to hypertension. Patient also with complaint of congestion and difficulty breathing that started yesterday.

## 2020-05-18 NOTE — ED Provider Notes (Signed)
[facility] Emergency Department Provider Note ____________________________________________   First MD Initiated Contact with Patient 05/18/20 2254     (approximate)  I have reviewed the triage vital signs and the nursing notes.  HISTORY  Chief Complaint Hypertension   HPI Alicia Cherry is a 69 y.o. femalewho presents to the ED for evaluation of hypertension.   Chart review indicates hx HTN, HLD.  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  Patient lives at home alone and ambulates independently without assistance.  Patient reports she has felt upper respiratory congestion and clear rhinorrhea for the past 2 days, she reports a mild right-sided temporal headache alongside this in a similar timeframe.  She reports a mild aching headache that is 4/10 intensity, constant, transiently improved with home ibuprofen.  She reports that she typically checks her blood pressure at home every 1-2 days, and she checked it in a regular fashion this evening and noted hypertension to 190/120.  Due to this, she called her physician's office and she was directed to the ED for evaluation.  Patient reports that she feels fine right now, aside from her mild right-sided temporal headache and frequent "sniffling."  She denies any recent falls, head trauma, difficulty walking or holding objects, chest pain, shortness of breath, pleuritic pains, vision changes.   Past Medical History:  Diagnosis Date  . Allergy   . Brain bleed (HCC)   . GERD (gastroesophageal reflux disease)   . Hypertension     Patient Active Problem List   Diagnosis Date Noted  . 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 Thalamic hemorrhage (HCC) 12/18/2019  . Essential hypertension 12/18/2019  . Mixed hyperlipidemia 12/18/2019  . History of cerebrovascular accident (CVA) with residual deficit 12/18/2019  . Cognitive deficit due to recent cerebrovascular accident (CVA) 12/18/2019    Past Surgical  History:  Procedure Laterality Date  . ABDOMINAL HYSTERECTOMY    . APPENDECTOMY    . CHOLECYSTECTOMY    . TONSILLECTOMY      Prior to Admission medications   Medication Sig Start Date End Date Taking? Authorizing Provider  atorvastatin (LIPITOR) 80 MG tablet Take 1 tablet (80 mg total) by mouth daily. 01/05/20   Karamalegos, Netta Neat, DO  losartan (COZAAR) 50 MG tablet Take 1 tablet by mouth once daily 01/30/20   Smitty Cords, DO    Allergies Tylenol [acetaminophen]  No family history on file.  Social History Social History   Tobacco Use  . Smoking status: Never Smoker  . Smokeless tobacco: Never Used  Substance Use Topics  . Alcohol use: Not Currently  . Drug use: Never    Review of Systems  Constitutional: No fever/chills Eyes: No visual changes. ENT: No sore throat.  Positive for upper respiratory congestion and clear rhinorrhea. Cardiovascular: Denies chest pain. Respiratory: Denies shortness of breath. Gastrointestinal: No abdominal pain.  No nausea, no vomiting.  No diarrhea.  No constipation. Genitourinary: Negative for dysuria. Musculoskeletal: Negative for back pain. Skin: Negative for rash. Neurological: Negative for, focal weakness or numbness.  Positive for headache  ____________________________________________   PHYSICAL EXAM:  VITAL SIGNS: Vitals:   05/18/20 2352 05/19/20 0015  BP: (!) 170/91 (!) 149/91  Pulse: 82 82  Resp: 18   Temp:    SpO2: 100% 95%     Constitutional: Alert and oriented. Well appearing and in no acute distress.  Ambulatory try to change the temperature in the room when I first entered.  Ambulatory independently with a normal gait.  Conversational full  sentences.  Occasionally sniffling and brought her own tissues to blow her nose in the room. Eyes: Conjunctivae are normal. PERRL. EOMI. Head: Atraumatic. Nose: Mild upper respiratory congestion and clear rhinorrhea is noted. Mouth/Throat: Mucous membranes are  moist.  Oropharynx non-erythematous. Uvula is midline Neck: No stridor. No cervical spine tenderness to palpation. Cardiovascular: Normal rate, regular rhythm. Grossly normal heart sounds.  Good peripheral circulation. Respiratory: Normal respiratory effort.  No retractions. Lungs CTAB. Gastrointestinal: Soft , nondistended, nontender to palpation. No CVA tenderness.. Musculoskeletal: No lower extremity tenderness nor edema.  No joint effusions. No signs of acute trauma. Neurologic:  Normal speech and language. No gross focal neurologic deficits are appreciated. No gait instability noted. Cranial nerves II through XII intact 5/5 strength and sensation in all 4 extremities Skin:  Skin is warm, dry and intact. No rash noted. Psychiatric: Mood and affect are normal. Speech and behavior are normal.  ____________________________________________   LABS (all labs ordered are listed, but only abnormal results are displayed)  Labs Reviewed  BASIC METABOLIC PANEL - Abnormal; Notable for the following components:      Result Value   Glucose, Bld 116 (*)    Creatinine, Ser 1.16 (*)    GFR, Estimated 52 (*)    All other components within normal limits  CBC  TROPONIN I (HIGH SENSITIVITY)  TROPONIN I (HIGH SENSITIVITY)   ____________________________________________  12 Lead EKG  Sinus rhythm, rate of 87bpm, normal axis and intervals. No evidence of acute ischemia.  ____________________________________________  RADIOLOGY  ED MD interpretation:  2 view CXR reviewed by me without evidence of acute cardiopulmonary pathology.  Official radiology report(s): DG Chest 2 View  Result Date: 05/18/2020 CLINICAL DATA:  69 year old female with shortness of breath. EXAM: CHEST - 2 VIEW COMPARISON:  None. FINDINGS: Shallow inspiration. No focal consolidation, pleural effusion, pneumothorax. The cardiac silhouette is within limits. No acute osseous pathology. Scoliosis and degenerative changes of the  spine. IMPRESSION: No active cardiopulmonary disease. Electronically Signed   By: Elgie Collard M.D.   On: 05/18/2020 21:51   CT Head Wo Contrast  Result Date: 05/19/2020 CLINICAL DATA:  Hypertension and headache. History of intracranial hemorrhage. EXAM: CT HEAD WITHOUT CONTRAST TECHNIQUE: Contiguous axial images were obtained from the base of the skull through the vertex without intravenous contrast. COMPARISON:  None. FINDINGS: Brain: There is no mass, hemorrhage or extra-axial collection. The size and configuration of the ventricles and extra-axial CSF spaces are normal. The brain parenchyma is normal, without acute or chronic infarction. Vascular: No abnormal hyperdensity of the major intracranial arteries or dural venous sinuses. No intracranial atherosclerosis. Skull: The visualized skull base, calvarium and extracranial soft tissues are normal. Sinuses/Orbits: No fluid levels or advanced mucosal thickening of the visualized paranasal sinuses. No mastoid or middle ear effusion. The orbits are normal. IMPRESSION: Normal head CT. Electronically Signed   By: Deatra Robinson M.D.   On: 05/19/2020 00:19    ____________________________________________   PROCEDURES and INTERVENTIONS  Procedure(s) performed (including Critical Care):  .1-3 Lead EKG Interpretation Performed by: Delton Prairie, MD Authorized by: Delton Prairie, MD     Interpretation: normal     ECG rate:  74   ECG rate assessment: normal     Rhythm: sinus rhythm     Ectopy: none     Conduction: normal      Medications  naproxen (NAPROSYN) tablet 500 mg (has no administration in time range)    ____________________________________________   MDM / ED COURSE   69 year old  woman with history of HTN and spontaneous ICH presents to the ED with asymptomatic hypertension, without evidence of additional acute pathology, and amenable to outpatient management.  BP elevated to 180/90 on presentation, normalizing to 150/90 without  intervention.  Patient reports a couple days of upper respiratory congestion, and while she indicates an associated mild aching headache, she has had no acute worsening of this headache or discrete symptoms to suggest SAH or repeat IPH.  Because of the vague presentation of her previous ICH, which was just forgetfulness, we discussed repeat head imaging and she is agreeable.  CT head demonstrates no evidence of acute intracranial pathology such as SAH or IPH.  Her neuro exam is normal, and her overall exam is reassuring without evidence of pathology beyond mild respiratory congestion.  She has her antihypertensive medication at home and she reports compliance.  I see no evidence of ischemic pathology on her EKG and her troponin is negative.  We discussed keeping track of her blood pressure at home and keeping a blood pressure log book to bring to her PCP to discuss any adjustments to her antihypertensive regimen.  We discussed return precautions for the ED.  Patient medically stable for discharge home.   Clinical Course as of May 19 50  Sat May 18, 2020  2331 171/94 while we're talking. She is agreeable to CT head   [DS]  Sun May 19, 2020  0045 Reassessed.  Patient reports that she feels well.  Educated patient on reassuring CT head and work-up overall.  Patient refused repeat troponin indicating that she is ready to go home.  We discussed return precautions for the ED.  We discussed following up with the PCP, who she does not have.  She reports she is currently the process of moving to [place] and declines information for a local PCP here.  I urged her to start taking her blood pressure at home regularly when she feels normal and to generate a BP log book to bring to her PCP to discuss her antihypertensive regimen.  Discussed return precautions for the ED.   [DS]    Clinical Course User Index [DS] Delton Prairie, MD    ____________________________________________   FINAL CLINICAL  IMPRESSION(S) / ED DIAGNOSES  Final diagnoses:  Primary hypertension  Asymptomatic hypertension  Congestion of upper respiratory tract     ED Discharge Orders    None       Geo Slone Katrinka Blazing   Note:  This document was prepared using Dragon voice recognition software and may include unintentional dictation errors.   Delton Prairie, MD 05/19/20 [phone]

## 2020-05-18 NOTE — ED Notes (Signed)
To CT

## 2020-05-19 MED ORDER — NAPROXEN 500 MG PO TABS
500.0000 mg | ORAL_TABLET | Freq: Once | ORAL | Status: AC
  Administered 2020-05-19: 500 mg via ORAL
  Filled 2020-05-19: qty 1

## 2020-05-19 NOTE — Discharge Instructions (Signed)
As we discussed, I would recommend that you start a blood pressure log book/diary to document her blood pressures at home in a normal setting.  Daily, or every other day, particularly when you are feeling normal please check your blood pressure at home and document this number.  It will be very helpful to bring this information to a primary care physician to discuss your blood pressure regimen.  If you develop any fevers with your symptoms, strokelike symptoms, worsening forgetfulness, please return to the ED immediately.

## 2020-05-19 NOTE — ED Notes (Signed)
Returned from CT.

## 2020-06-06 ENCOUNTER — Telehealth: Payer: Self-pay | Admitting: General Practice

## 2020-06-06 ENCOUNTER — Telehealth: Payer: BC Managed Care – PPO

## 2020-06-06 NOTE — Telephone Encounter (Cosign Needed)
  Chronic Care Management   Note  06/06/2020 Name: Alicia Cherry MRN: [id number] DOB: 1950/11/17  Outreach call to the patient for [facility] outreach follow up. The patient states that she has relocated with her job to [place] and has been there for about 2.5 weeks. She has not established with a new provider. Encouraged the patient to find a pcp and get established in her new area. The patient verbalized understanding. Review of what worse looks like and to seek emergent care as needed. Will let the staff at [facility] know the patient has relocated to [place].   Follow up plan: No further follow up required: the patient has relocated to [place] and no longer will see Dr. Althea Charon.  Alto Denver RN, MSN, CCM Community Care Coordinator [place]  [facility] [place] Mobile: [phone]

## 2022-04-18 IMAGING — CT CT HEAD W/O CM
3 series · 16 of 47 positions shown, 19 images · non-contrast
Comparison: None.

CLINICAL DATA: Hypertension and headache. History of intracranial
hemorrhage.

EXAM:
CT HEAD WITHOUT CONTRAST
TECHNIQUE: Contiguous axial images were obtained from the base of the skull
through the vertex without intravenous contrast.

[Series 2: head wo · axial · 0.41mm/px · z∈[+247,+372]mm · 10 of 31 slices shown, 13 images]
[im 3/31  brain]
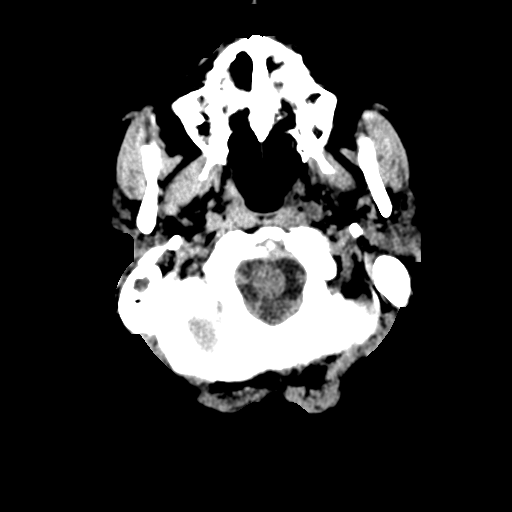
[im 3/31  bone]
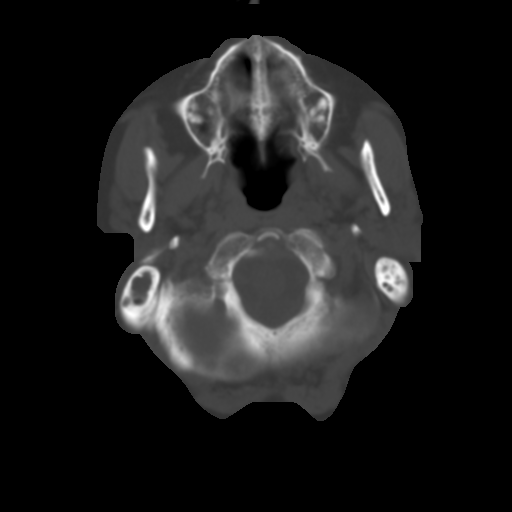
[im 6/31  brain]
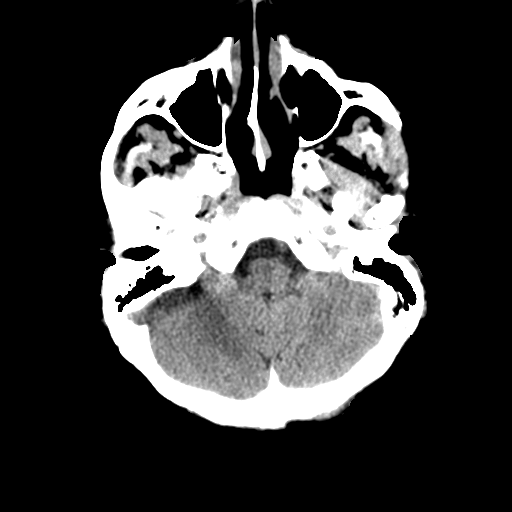
[im 9/31  brain]
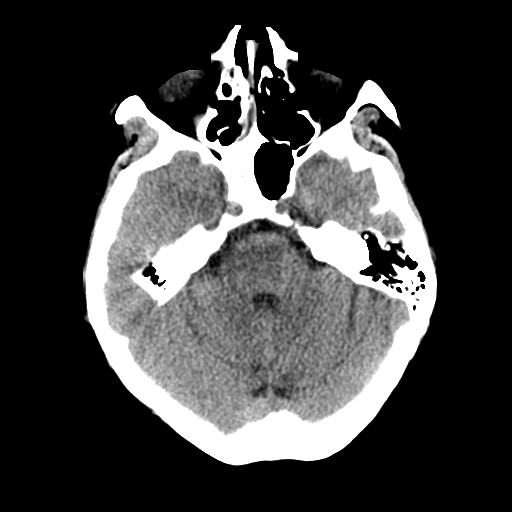
[im 11/31  brain]
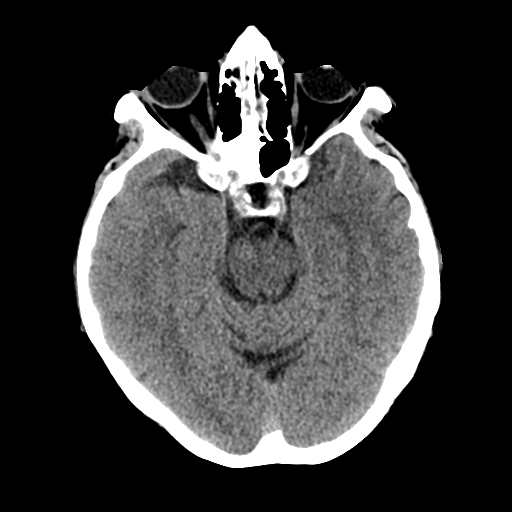
[im 14/31  brain]
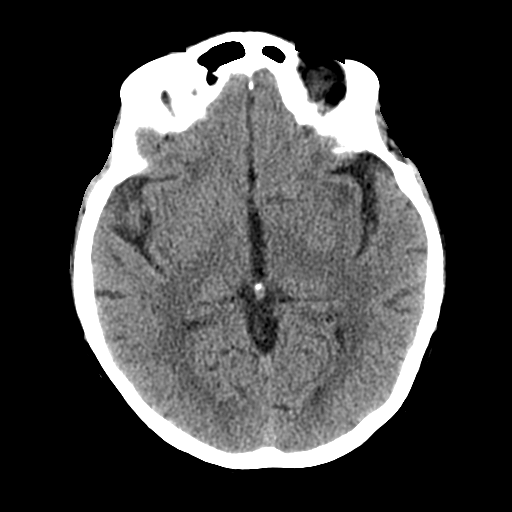
[im 14/31  bone]
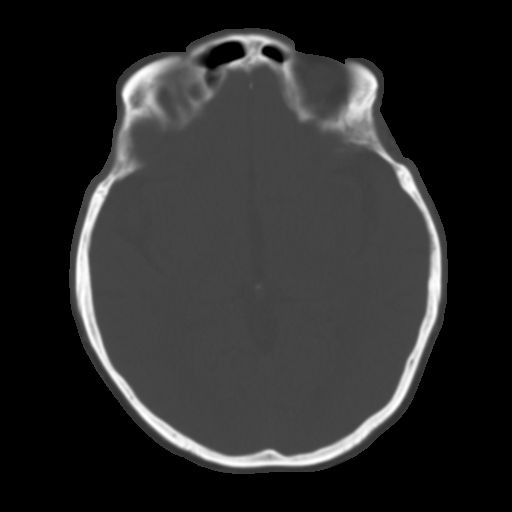
[im 17/31  brain]
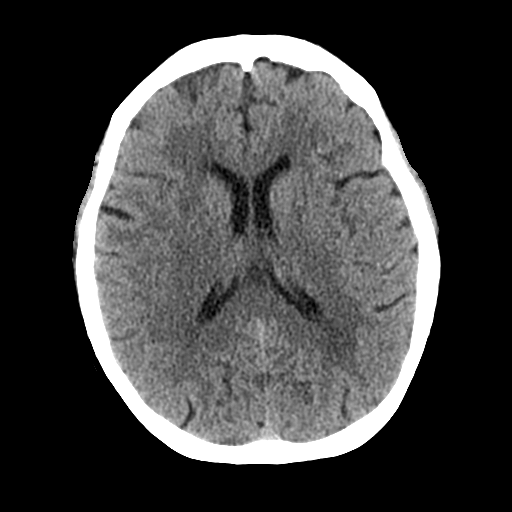
[im 20/31  brain]
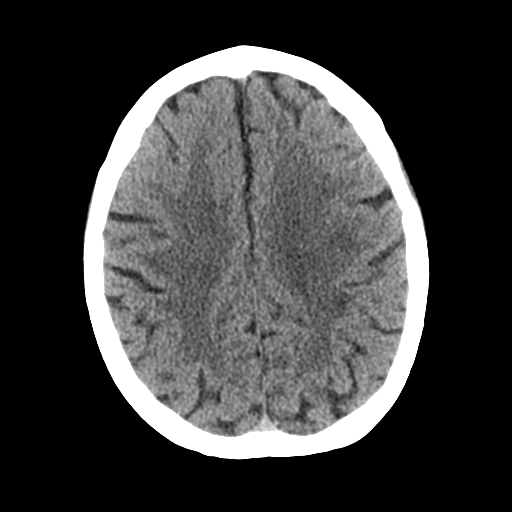
[im 23/31  brain]
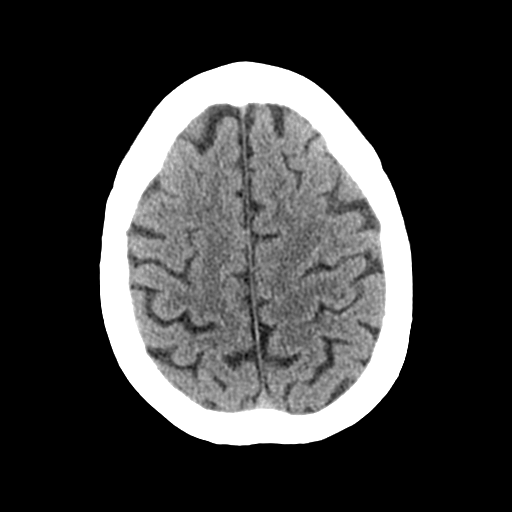
[im 25/31  brain]
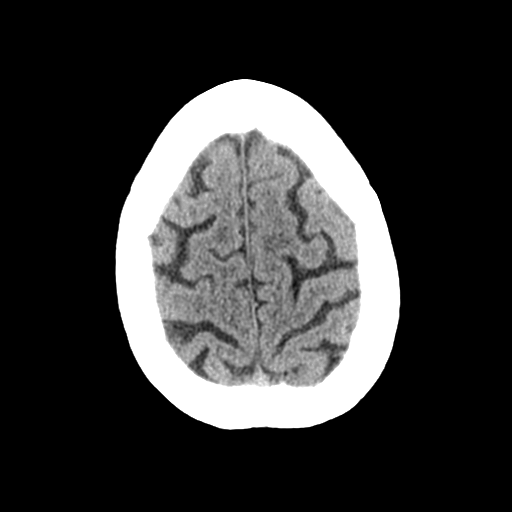
[im 25/31  bone]
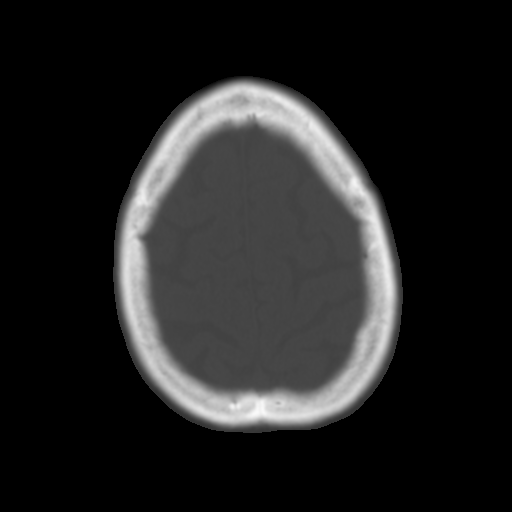
[im 28/31  brain]
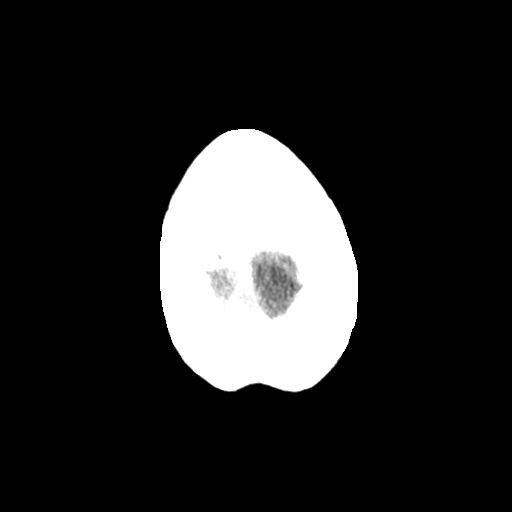

[Series 4: coronal soft tissue · coronal · 0.32mm/px · 3 of 67 slices shown]
[im 23/67  brain]
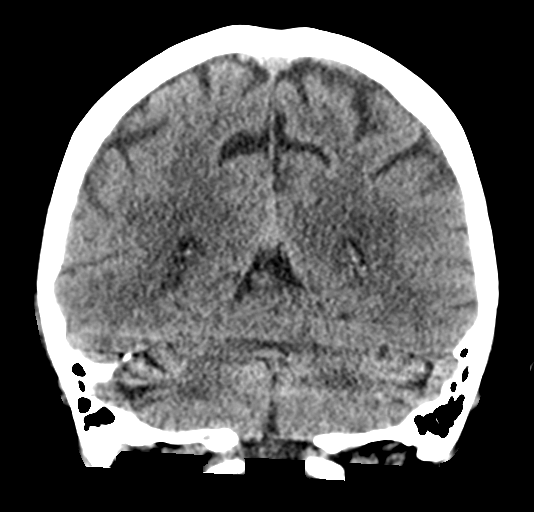
[im 30/67  brain]
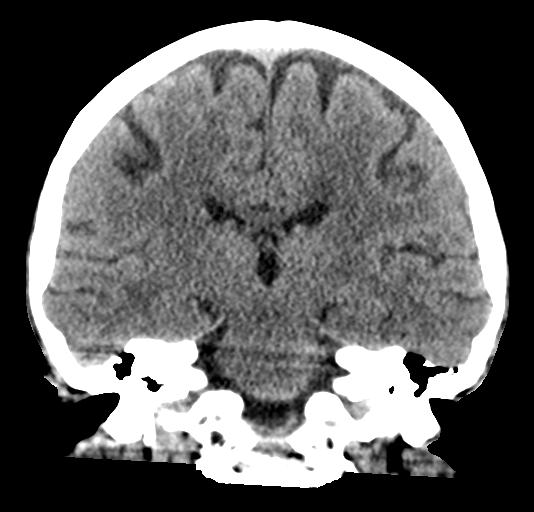
[im 37/67  brain]
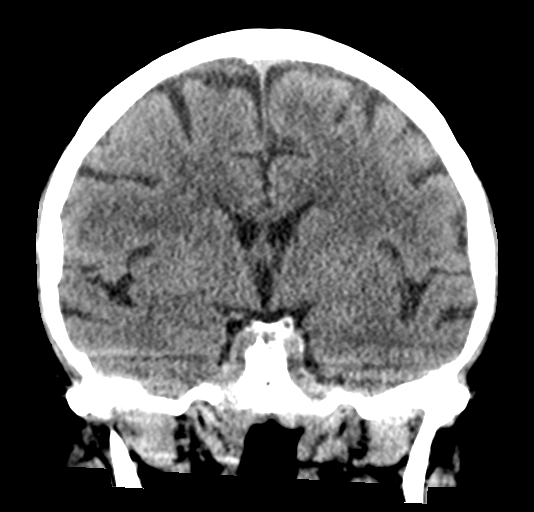

[Series 5: sagittal soft tissue · sagittal · 0.30mm/px · 3 of 55 slices shown]
[im 19/55  brain]
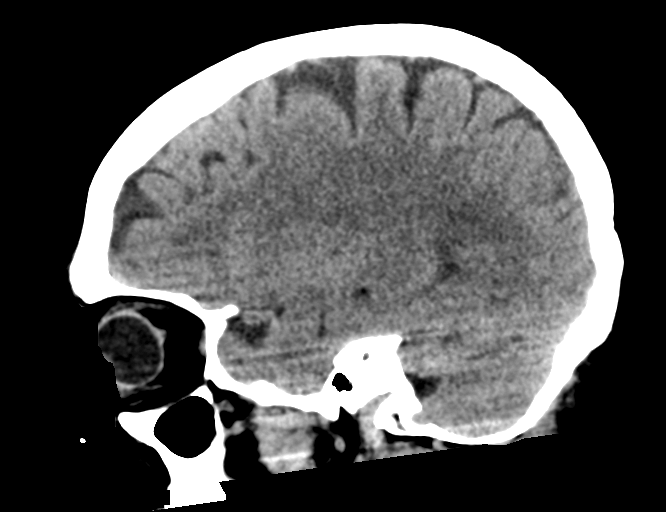
[im 28/55  brain]
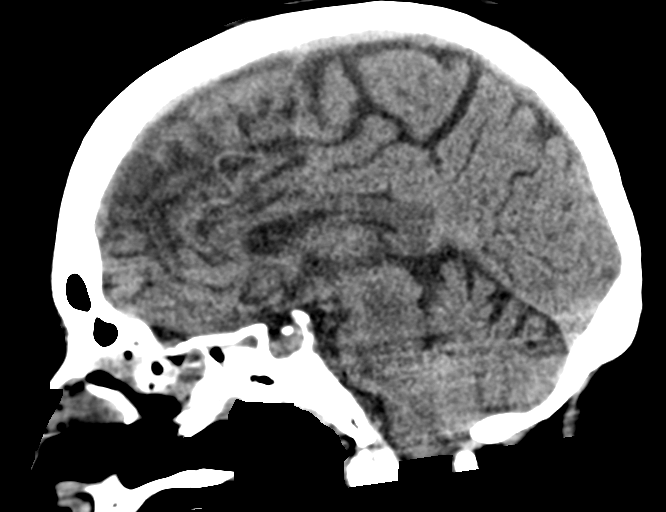
[im 37/55  brain]
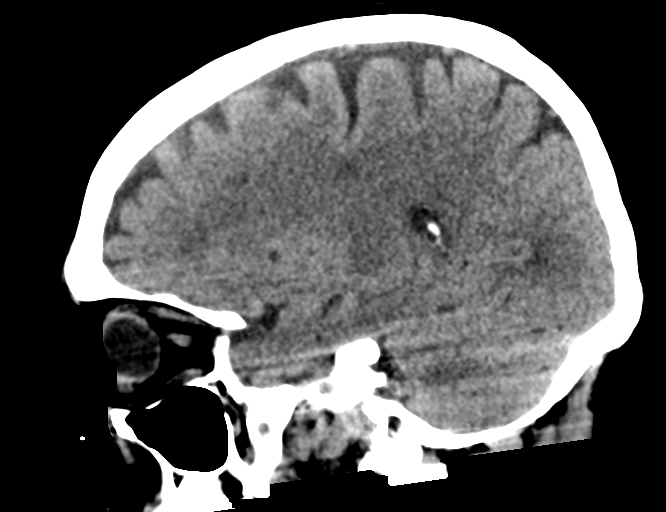

[16 of 47 positions shown; findings below may reference images not displayed]

FINDINGS: Brain: There is no mass, hemorrhage or extra-axial collection. The
size and configuration of the ventricles and extra-axial CSF spaces
are normal. The brain parenchyma is normal, without acute or chronic
infarction.

Vascular: No abnormal hyperdensity of the major intracranial
arteries or dural venous sinuses. No intracranial atherosclerosis.

Skull: The visualized skull base, calvarium and extracranial soft
tissues are normal.

Sinuses/Orbits: No fluid levels or advanced mucosal thickening of
the visualized paranasal sinuses. No mastoid or middle ear effusion.
The orbits are normal.
IMPRESSION: Normal head CT.
